# Patient Record
Sex: Female | Born: 1960 | Race: White | Hispanic: No | Marital: Married | Smoking: Never smoker
Health system: Southern US, Community
[De-identification: ages and names within clinical notes are randomized; demographics above are authoritative.]

## PROBLEM LIST (undated history)

## (undated) DIAGNOSIS — I872 Venous insufficiency (chronic) (peripheral): Secondary | ICD-10-CM

## (undated) DIAGNOSIS — I270 Primary pulmonary hypertension: Secondary | ICD-10-CM

## (undated) DIAGNOSIS — Z872 Personal history of diseases of the skin and subcutaneous tissue: Secondary | ICD-10-CM

## (undated) DIAGNOSIS — K219 Gastro-esophageal reflux disease without esophagitis: Secondary | ICD-10-CM

## (undated) DIAGNOSIS — Z800 Family history of malignant neoplasm of digestive organs: Secondary | ICD-10-CM

## (undated) DIAGNOSIS — Z8701 Personal history of pneumonia (recurrent): Secondary | ICD-10-CM

## (undated) DIAGNOSIS — N319 Neuromuscular dysfunction of bladder, unspecified: Secondary | ICD-10-CM

## (undated) DIAGNOSIS — I890 Lymphedema, not elsewhere classified: Secondary | ICD-10-CM

## (undated) DIAGNOSIS — Z9221 Personal history of antineoplastic chemotherapy: Secondary | ICD-10-CM

## (undated) DIAGNOSIS — C50912 Malignant neoplasm of unspecified site of left female breast: Secondary | ICD-10-CM

## (undated) DIAGNOSIS — R112 Nausea with vomiting, unspecified: Secondary | ICD-10-CM

## (undated) DIAGNOSIS — Z789 Other specified health status: Secondary | ICD-10-CM

## (undated) DIAGNOSIS — G4733 Obstructive sleep apnea (adult) (pediatric): Secondary | ICD-10-CM

## (undated) DIAGNOSIS — Z8619 Personal history of other infectious and parasitic diseases: Secondary | ICD-10-CM

## (undated) DIAGNOSIS — F329 Major depressive disorder, single episode, unspecified: Secondary | ICD-10-CM

## (undated) DIAGNOSIS — Z9889 Other specified postprocedural states: Secondary | ICD-10-CM

## (undated) DIAGNOSIS — Z9989 Dependence on other enabling machines and devices: Secondary | ICD-10-CM

## (undated) DIAGNOSIS — M797 Fibromyalgia: Secondary | ICD-10-CM

## (undated) DIAGNOSIS — F32A Depression, unspecified: Secondary | ICD-10-CM

## (undated) DIAGNOSIS — M1990 Unspecified osteoarthritis, unspecified site: Secondary | ICD-10-CM

## (undated) DIAGNOSIS — E049 Nontoxic goiter, unspecified: Secondary | ICD-10-CM

## (undated) DIAGNOSIS — F419 Anxiety disorder, unspecified: Secondary | ICD-10-CM

## (undated) DIAGNOSIS — Z803 Family history of malignant neoplasm of breast: Secondary | ICD-10-CM

## (undated) HISTORY — DX: Family history of malignant neoplasm of digestive organs: Z80.0

## (undated) HISTORY — PX: CARDIAC CATHETERIZATION: SHX172

## (undated) HISTORY — PX: TRANSTHORACIC ECHOCARDIOGRAM: SHX275

## (undated) HISTORY — PX: BILATERAL SALPINGOOPHORECTOMY: SHX1223

## (undated) HISTORY — PX: OTHER SURGICAL HISTORY: SHX169

## (undated) HISTORY — DX: Family history of malignant neoplasm of breast: Z80.3

---

## 1986-12-08 HISTORY — PX: APPENDECTOMY: SHX54

## 1989-12-08 HISTORY — PX: ABDOMINAL HYSTERECTOMY: SHX81

## 1991-12-09 HISTORY — PX: HERNIA REPAIR: SHX51

## 1998-08-10 ENCOUNTER — Encounter: Admission: RE | Admit: 1998-08-10 | Discharge: 1998-08-10 | Payer: Self-pay | Admitting: Sports Medicine

## 1999-12-09 HISTORY — PX: CARDIOVASCULAR STRESS TEST: SHX262

## 2002-12-08 HISTORY — PX: INTERSTIM IMPLANT PLACEMENT: SHX5130

## 2005-12-08 HISTORY — PX: CARPAL TUNNEL RELEASE: SHX101

## 2006-03-17 ENCOUNTER — Ambulatory Visit (HOSPITAL_COMMUNITY): Admission: RE | Admit: 2006-03-17 | Discharge: 2006-03-17 | Payer: Self-pay | Admitting: Urology

## 2006-07-01 ENCOUNTER — Ambulatory Visit (HOSPITAL_COMMUNITY): Admission: RE | Admit: 2006-07-01 | Discharge: 2006-07-02 | Payer: Self-pay | Admitting: Urology

## 2006-07-01 HISTORY — PX: BLADDER SUSPENSION: SHX72

## 2006-11-10 HISTORY — PX: REMOVAL OF URINARY SLING: SHX6218

## 2006-11-11 ENCOUNTER — Inpatient Hospital Stay (HOSPITAL_COMMUNITY): Admission: RE | Admit: 2006-11-11 | Discharge: 2006-11-12 | Payer: Self-pay | Admitting: Urology

## 2008-02-02 IMAGING — CR DG CHEST 2V
3 series · 3 of 3 positions shown · non-contrast
Comparison: None available.

CLINICAL DATA: Stress incontinence. 
 CHEST - 2 VIEW:

[view not recorded (1 of 3)]
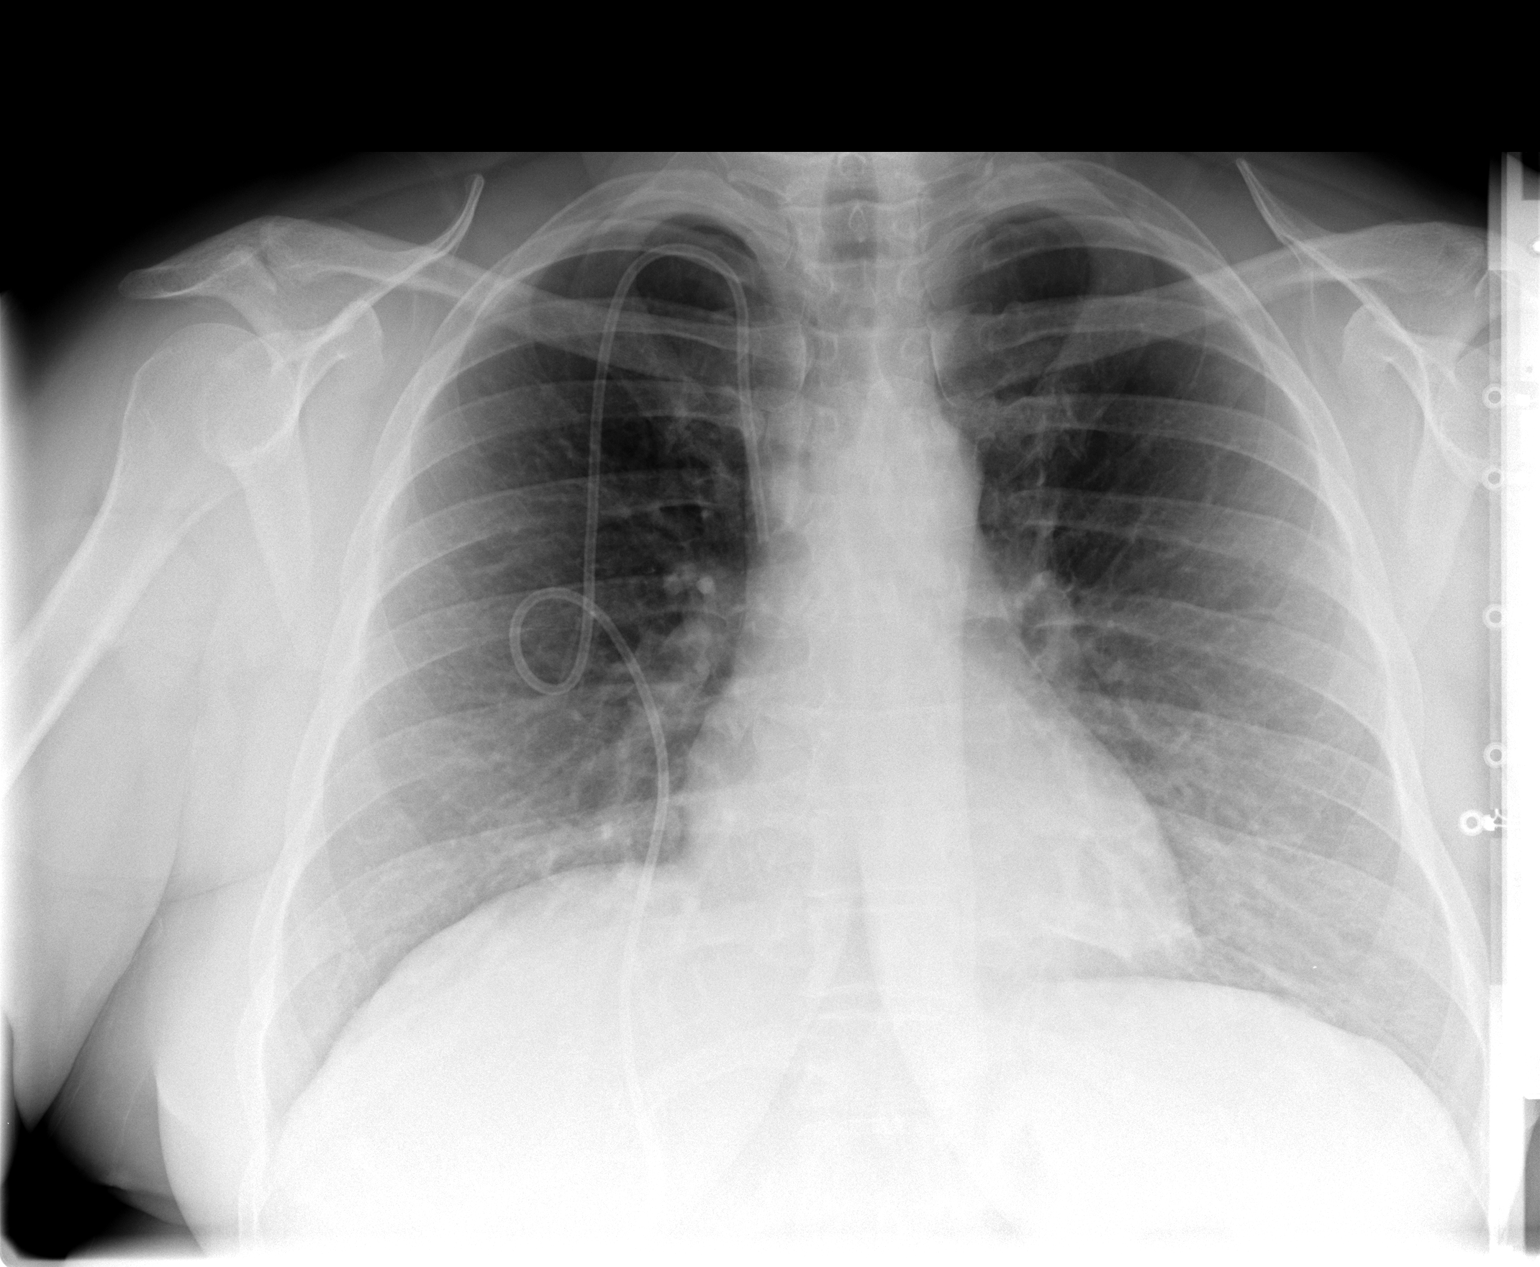

[view not recorded (2 of 3)]
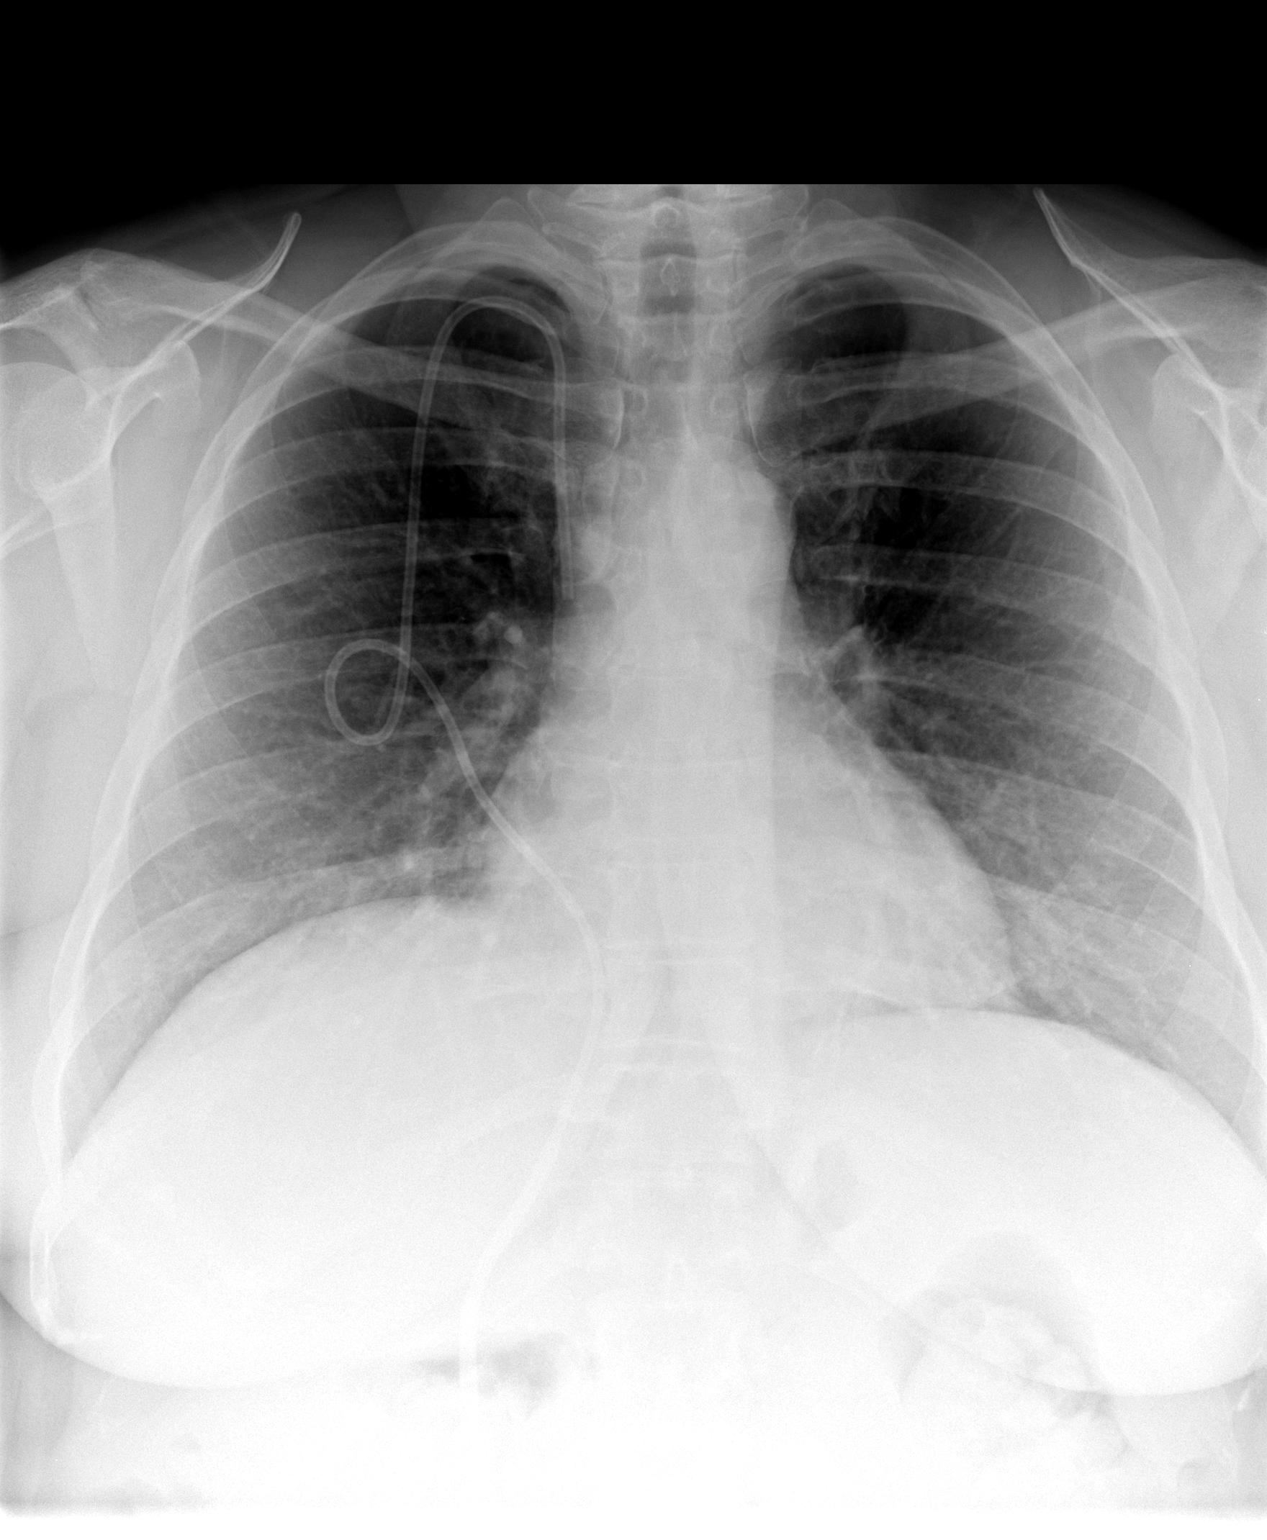

[view not recorded (3 of 3)]
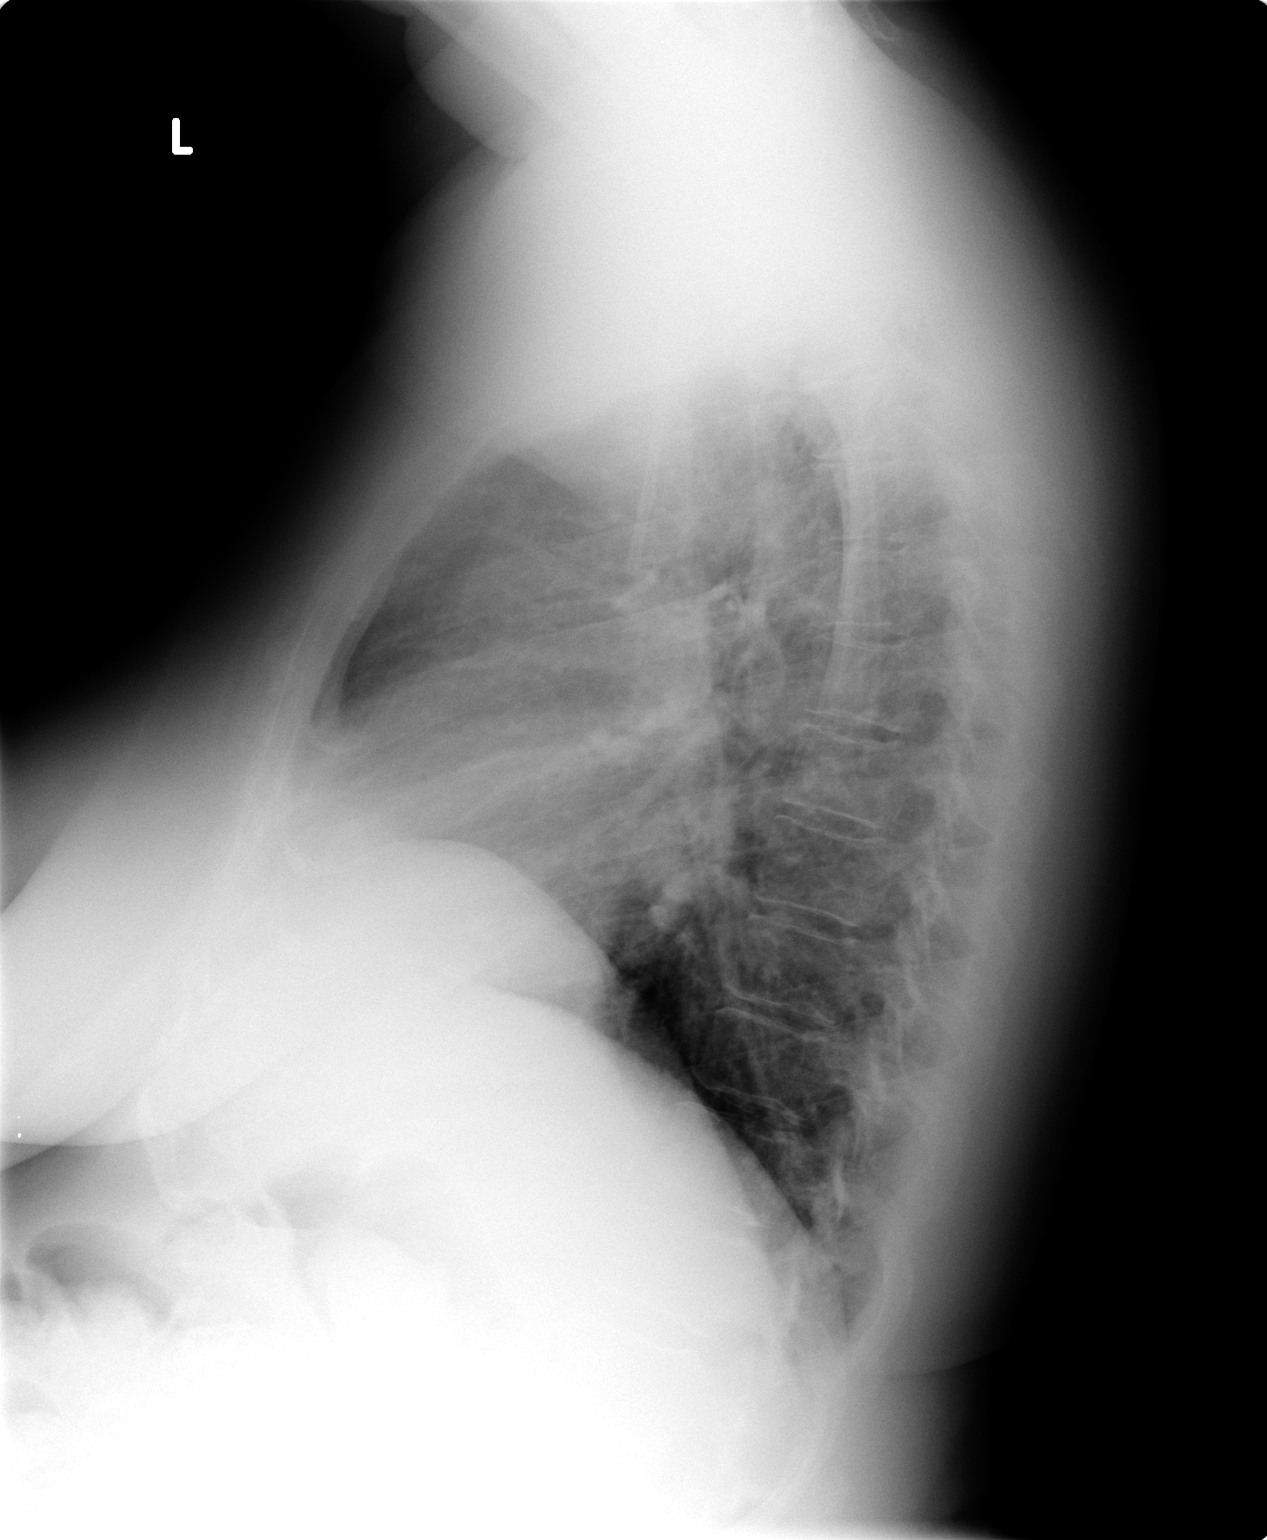

[3 of 3 positions shown; findings below may reference images not displayed]

FINDINGS: Right-sided central venous catheter is noted with tip in the projection of the SVC.  The heart size is normal.  There are no effusions.  No interstitial edema. 
 There are no focal lung opacities. 
 Review of the visualized osseous structures is unremarkable.
IMPRESSION: No active cardiopulmonary disease.

## 2009-12-08 HISTORY — PX: BELPHAROPTOSIS REPAIR: SHX369

## 2010-08-05 ENCOUNTER — Ambulatory Visit (HOSPITAL_BASED_OUTPATIENT_CLINIC_OR_DEPARTMENT_OTHER): Admission: RE | Admit: 2010-08-05 | Discharge: 2010-08-05 | Payer: Self-pay | Admitting: Urology

## 2010-08-05 HISTORY — PX: OTHER SURGICAL HISTORY: SHX169

## 2011-02-21 LAB — POCT I-STAT, CHEM 8
Calcium, Ion: 1.2 mmol/L (ref 1.12–1.32)
Chloride: 104 mEq/L (ref 96–112)
Creatinine, Ser: 0.8 mg/dL (ref 0.4–1.2)
Glucose, Bld: 84 mg/dL (ref 70–99)

## 2011-04-25 NOTE — Op Note (Signed)
NAME:  Diana Carroll, Diana Carroll             ACCOUNT NO.:  [id number]   MEDICAL RECORD NO.:  [id number]          PATIENT TYPE:  OIB   LOCATION:  19                         FACILITY:  [facility]   PHYSICIAN:  Reece Packer, MD DATE OF BIRTH:  02-02-1961   DATE OF PROCEDURE:  11/10/2006  DATE OF DISCHARGE:                               OPERATIVE REPORT   SURGEON:  Reece Packer, MD   ASSISTANT:  Peterson Lombard, MD   PREOPERATIVE NOTE:  Stress incontinence surgery, ureterolysis, plus  pubovaginal sling, plus cystoscopy.   INDICATIONS:  Diana Carroll has a neurogenic bladder.  She is on self-  catheterization.  She failed the [facility] sling.  My plan was to remove  this suburethral component of the synthetic sling and do a compressive  pubovaginal sling.   The patient was prepped and draped in the usual fashion.  Extra care was  taken to minimize the risk of compartment syndrome neuropathy and DVT.  Preoperative blood work was normal.  IV Remodulin was given before,  during, and after surgery.   The patient had a narrow introitus.  She had a high riding bladder neck  which made exposure little bit more difficult.  I used a weighted  vaginal speculum and a Kerr __________ retractor.  I made a 2.5 cm  incision in the midline underneath the urethra.  I dissected sharply;  and then identified the urethrovesical angle bilaterally.  I used the  double ring retractor, which worked very nicely.  Fortunately it was  very easy to see the synthetic sling in the mid urethra.  I dissected  down sharply with a scalpel; and then passed a small curved right angle  instrument around the sling.  I then cut the sling in the midline.  I  placed a tonsil on each free end and traced it up to the endopelvic  fascia bilaterally.  There was no injury to the urethra.   I then made a 4-cm incision one fingerbreadth above the symphysis pubis  in the area of a previous Pfannenstiel incision.  I dissected down  through a lot of subcutaneous fat to the scarred rectus fascia, one  fingerbreadth above the symphysis pubis.  On the patient's left I used a  double Pereira needle passer; and brought it down through the fascia  onto the pulp of my left index finger, up thorough the urethrovesical  angle.  I did the same thing on the right side.   I then cystoscoped the patient.  There was efflux of indigo carmine from  both ureteral orifices.  I thought the left Pereira needle was little  bit close to the bladder.  For this reason I removed it; and then passed  a single-pronged Stamey needle onto the pulp of my finger, down through  retropubic space, and out through the vaginal incision.  I recystoscoped  the patient and I was happy with its position.   I had previously cut a 10 x 2 cm dermal sling.  I over sewed each end  with #0 Prolene suture.  The sling and Prolene suture was brought  through the eye opening of the needle passers and brought up through the  retropubic space, up through the fascia.  There was a tissue bridge of  approximately 12 or 13 mm on each side.  I then used a benign bolster  system so the suture would not tear through the fascia.  A suture bridge  was made on the left side with a Mayo needle; and then both free ends  were brought out through the bolster.  The same was done on the right  side.   I then cystoscoped the patient; and once again the bladder was normal.  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  There was efflux of indigo carmine from both ureteral orifices.  I could  scope with a 22-degree lens parallel to the floor; and I could see a  little bit of collagen bleb from previous surgery.  Then I could pull up  on the dermal sling and easily compress the mid urethra in a winking  fashion.  Using direct cystoscopic vision, I had my assistant, and then  myself, tie down the Prolene sutures over the bolster.  I was very happy  with its  tension.  I cystoscoped the patient several times; and there is  no question that her urethra was coapted in the midline.  I could scope  through it; and I also on several occasions, passed a 16-French  catheter, through the now coapted urethra.   Copious irrigation was used in the vagina; and an abdominal incision.  I  did sew all the free ends of the Prolene together in the midline.  I  then used a 3-0 Vicryl for the subcutaneous tissue and then 4-0  subcuticular.  A dry dressing with Telfa was applied.  Then 2-0 Vicryl  was used to close the vaginal wall incision.   Total blood loss was approximately 400 mL.  When I first broke through  the endopelvic fascia on the left side she had bleeding; but this had  settled down by midway through the case.  A firm vaginal pack was  inserted.  Foley catheter draining well at the end of the case.  Leg  position was good.   I was very pleased with removal of the suburethral component of the  synthetic sling; and the application of a compressive sling in the  proximal urethra and mid urethra.  Hopefully this will reach her  treatment goal.           ______________________________  Reece Packer, MD  Electronically Signed     SAM/MEDQ  D:  11/10/2006  T:  11/10/2006  Job:  [id number]

## 2011-04-25 NOTE — Op Note (Signed)
Diana Carroll, Diana Carroll             ACCOUNT NO.:  [id number]   MEDICAL RECORD NO.:  [id number]          PATIENT TYPE:  AMB   LOCATION:  DAY                          FACILITY:  [facility]   PHYSICIAN:  Reece Packer, MD DATE OF BIRTH:  Jul 09, 1961   DATE OF PROCEDURE:  03/17/2006  DATE OF DISCHARGE:                                 OPERATIVE REPORT   PREOPERATIVE DIAGNOSIS:  Stress incontinence.   POSTOPERATIVE DIAGNOSIS:  Stress incontinence.   SURGERY:  Cystoscopy, transurethral collagen injection therapy.   Kelda Azad has a complicated history.  She is on clean intermittent  catheterization.  She has stress incontinence.  She was given intravenous  ciprofloxacin prior to procedure.   The patient was prepped and draped in the usual fashion.  The injection  scope was used for the initial inspection of the bladder.  The bladder  mucosa and trigone were normal.  There is no stitch, foreign body, or  carcinoma.  On inspection of the urethra, she actually had a fairly long  urethra, approximately 3 cm in length.   I injected three syringes of collagen at 3, 6, and 9 o'clock.  Unfortunately, there was extravasation in the 3 and 6 o'clock sites, but the  injection at 9 o'clock went very well and two syringes were utilized.  I was  hoping to inject more, especially at 12 o'clock, but unfortunately, she  developed hypotension during the case.  She is on a prostaglandin inhibitor  medical continuous drip for pulmonary hypertension.  Anesthesia felt that  this medication, in combination with her anesthesia, caused hypertension.  Her systolic dropped as low as approximately 60.  Anesthesia did a wonderful  job in having her blood pressure come up quite quickly utilizing the  Trendelenburg position, fluid resuscitation, and Neo-Synephrine drip.  She  was finally extubated in the operating room.  She was brought to the  recovery room.  She will be observed closely postoperatively.  The  decision  to send her home will be made by myself in combination with anesthesia.   During the case, her hypotension was severe enough that anesthesia was more  comfortable if I ended the injections quite abruptly so I did so.  I did not  examine her for her rectocele.  Overall, her injections went reasonably  well, but technically she did not coapt her urethra as well as I would like,  and again, there was some perforation sites with extravasation.  The  decision to inject further or to do surgery will be influenced by the  hypotension episode that was experienced today under anesthesia.           ______________________________  Reece Packer, MD  Electronically Signed     SAM/MEDQ  D:  03/17/2006  T:  03/17/2006  Job:  [id number]

## 2011-04-25 NOTE — Op Note (Signed)
NAMEASHLYN, [name]             ACCOUNT NO.:  [id number]   MEDICAL RECORD NO.:  [id number]          PATIENT TYPE:  AMB   LOCATION:  DAY                          FACILITY:  [facility]   PHYSICIAN:  Reece Packer, MD DATE OF BIRTH:  [birth year]   DATE OF PROCEDURE:  07/01/2006  DATE OF DISCHARGE:                                 OPERATIVE REPORT   PREOP DIAGNOSIS:  Stress incontinence with neurogenic bladder.   POSTOPERATIVE DIAGNOSIS:  Stress incontinence with neurogenic bladder.   SURGERY:  Sling cystourethropexy [facility]) plus cystoscopy.   Diana Carroll is a patient with neurogenic bladder.  She is on self-  catheterization.  She has stress incontinence.   She was prepped and draped in the usual fashion.  She was given preoperative  antibiotics.  Extra care was taken to position her legs to minimize the risk  of neuropathy, DVT and compartment syndrome.   A weighted vaginal speculum, curved __________ and Foley catheter was used  for exposure.  She did have a narrow introitus making the dissection a  little bit more difficult.  Two 1-cm incisions were made just lateral to the  midline one fingerbreadth above the symphysis pubis.  A 2.5-cm incision was  made overlying the urethra anteriorly.  I made a deep incision, making  certain that the pubocervical fascia and vaginal wall was thick to make a  nice flap to cover the sling.  I dissected bluntly with Metzenbaum scissors  to the symphysis pubis bilaterally.  Bleeding was minimal.  I could palpate  the bone bilaterally.   With the bladder empty I passed the [facility] needle on top of and then along  the back of the pubic bones parallel to the midline under the pulp of my  index finger.   I then cystoscoped the patient.  There was no injury to the bladder.  There  was no needle in the bladder even with wiggling it.  There was no  indentation.  There was reflux of indigo carmine from both ureteral  orifices.  Urethra was  normal.   I attached a sling and brought it up through the retropubic space as per  protocol with the bladder empty.  I tensioned the sling over a Kelly using  my usual technique.  I used the tip of the [place] as opposed to the more  proximal aspect to make it a little bit tighter.  There is no question the  sling was in the mid urethra.  The sling was not occluding or tenting the  urethra upward based upon direct visibility and cystoscopy.  The sling was a  little bit tighter than a normal loose sling tied in someone that is not  self-catheterizing.   The sling was cut in the suprapubic area just under the skin.  Copious  irrigation was utilized.  I did a running 2-0 Vicryl closure of the vaginal  incision followed by two interrupted sutures for further strength.  I closed  the suprapubic incisions with 4-0 Vicryl and then Dermabond.   Vaginal pack was inserted.  Foley catheter was draining well at the  end of  the case.  Leg position was good at the end of the case.   Hopefully the sling will reach the patient's treatment goal.           ______________________________  Reece Packer, MD  Electronically Signed     SAM/MEDQ  D:  07/01/2006  T:  07/01/2006  Job:  [id number]

## 2011-04-25 NOTE — H&P (Signed)
NAMETONNA, PALAZZI             ACCOUNT NO.:  [id number]   MEDICAL RECORD NO.:  [id number]          PATIENT TYPE:  INP   LOCATION:  58                         FACILITY:  [facility]   PHYSICIAN:  Reece Packer, MD DATE OF BIRTH:  03-10-1961   DATE OF ADMISSION:  11/10/2006  DATE OF DISCHARGE:  11/12/2006                              HISTORY & PHYSICAL   ADDENDUM:  Stress incontinence; admit for postoperative pain and  observation.   SURGERY:  Ureterolysis plus sling, history of urethropexy plus  cystoscopy.   Diana Carroll has complicated incontinence.  She has a neurogenic  bladder and performs self-catheterizations.  She has stress  incontinence.  She had a synthetic sling that was not effective.  The  sling was removed intraoperatively on December 4th.  Part of the  suburethral segment of the sling was removed on December 4th and a  nonsegment pubovaginal compressive sling was performed in addition to  cystoscopy.   One day following surgery, Diana Carroll's heart rate was mildly increased, and  her hemoglobin was 9.  She was having some discomfort, and I thought it  was best to bring her in overnight and admit her for observation.   She does have a lot of comorbidities and is on Remodulin for pulmonary  hypertension.  She also has lymphedema.  She continued to do well with  stable electrolytes, stable hemoglobin, and excellent urine output.  Her  abdominal incision looked good.   PAST HEALTH:  Pulmonary hypertension, lymphedema.  Other comorbidities  listed on her clinic notes.   MEDICATIONS:  Trimethoprim, Remodulin, Coumadin, Vicodin, Paxil,  Celebrex, Lyrica, Flexeril, Elavil, and other medications listed.   ALLERGIES:  MORPHINE, SULFA, and SUDAFED.   REVIEW OF SYSTEMS:  Otherwise noncontributory.   FAMILY HISTORY:  Is a nurse living in [place].   SOCIAL HISTORY:  Is active as a Marine scientist.   PHYSICAL EXAMINATION:  GENERAL:  Alert and oriented.  CARDIOVASCULAR:   Warm extremities.  Heart rate 75.  RESPIRATORY:  No shortness of breath at rest.  ABDOMEN:  No abdominal tenderness, masses, or hernia.  GENITOURINARY:  Well-supported bladder neck.  LYMPHATIC:  No pelvic lymph nodes.  MUSCULOSKELETAL:  Mobile lymphedema of the lower extremities.  NEUROLOGIC:  Normal perivaginal sensation.  SKIN:  No obvious rashes.   Diana Carroll was admitted to the hospital for observation with IV  fluids, pain medication, and antibiotics.           ______________________________  Reece Packer, MD  Electronically Signed     SAM/MEDQ  D:  11/12/2006  T:  11/12/2006  Job:  [id number]

## 2011-04-25 NOTE — Discharge Summary (Signed)
[name], [name]             ACCOUNT NO.:  [id number]   MEDICAL RECORD NO.:  [id number]          PATIENT TYPE:  INP   LOCATION:  76                         FACILITY:  [facility]   PHYSICIAN:  Reece Packer, MD DATE OF BIRTH:  30-Jun-1961   DATE OF ADMISSION:  11/10/2006  DATE OF DISCHARGE:  11/12/2006                               DISCHARGE SUMMARY   ADMISSION DIAGNOSIS:  Stress urinary incontinence; admitted for  postoperative pain and medical observation.   SURGERY:  December 4--urethrolysis plus sling; cystourethropexy.   SURGEON:  Reece Packer, MD.   ASSISTANT:  Peterson Lombard, MD   Diana Carroll has multiple comorbidities including pulmonary  hypertension, lymphedema, neurogenic bladder, and stress incontinence.  She underwent removal of the suburethral synthetic sling; and she  underwent a compressive pubovaginal sling.  One day following surgery  she was having some pain; and her hemoglobin had decreased to 9.0.  She  was a little bit tachycardic.  Because of her medical comorbidities, I  wanted to keep her in overnight; and admitted her to the hospital to  observe her for an extra day.  During the hospitalization her hemoglobin  was stable, her vitals were good, her urine output was excellent; and  she felt much better.  She was discharged home on December 6.   She was on Remodulin intravenously while in hospital.  She had no  medical problems with anesthesia or postoperatively with her breathing.  She was ambulating well.   On the day of discharge she could self-catheterize herself.  We went  over do's and don't and postoperative instructions.   I renewed her trimethoprim 100 mg daily, gave her Percocet #40,  ciprofloxacin 250 mg b.i.d. for 5 days, as well as Lovenox 150 mg #12.  She is going to restart her Coumadin and Lovenox 6 days following  surgery.   She is going to followup with me in two weeks' time.  My nurse will call  her in the next few days  to make sure she is doing well.           ______________________________  Reece Packer, MD  Electronically Signed     SAM/MEDQ  D:  11/12/2006  T:  11/12/2006  Job:  [id number]

## 2012-03-11 ENCOUNTER — Other Ambulatory Visit: Payer: Self-pay | Admitting: Urology

## 2012-04-05 ENCOUNTER — Encounter (HOSPITAL_BASED_OUTPATIENT_CLINIC_OR_DEPARTMENT_OTHER): Payer: Self-pay | Admitting: *Deleted

## 2012-04-05 NOTE — Progress Notes (Addendum)
To [facility] at 1000,Istat,Ekg on arrival -possible CXR-spoke w/Dr Denenny,will follow up with Dr Winfred Leeds also regarding pre-op needs.Npo after mn-to take tyvaso,tracleer,protonix with water only.Requested recent office visit note with pulmonologist Dr Kathi Ludwig. [phone])

## 2012-04-06 NOTE — Progress Notes (Signed)
Discussed with Dr Winfred Leeds pre-op needs-OK to use current CXR.

## 2012-04-07 NOTE — H&P (Signed)
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 She self catheterizes. She has failed Interstim. The symptoms are moderate to severe and affecting her quality of life. She has failed ___ and multiple antimuscarinics.   Today she underwent a number of tests which I personally reviewed. Urinalysis: Negative.  Her incontinence is stable. Her incomplete bladder emptying is stable.   Urinalysis: I reviewed, negative.    Past Medical History Problems  1. History of  Adult Sleep Apnea 780.57 2. History of  Anxiety (Symptom) 799.2 3. History of  Cervical Cancer V10.41 4. History of  Depression 311 5. History of  Electronic Bladder Stimulator 6. History of  Endoscopic Injection Of Implant Material Into Bladder Neck 7. History of  Esophageal Reflux 530.81 8. History of  Fibromyalgia 729.1 9. History of  Hypercholesterolemia 272.0 10. History of  Lymphedema 457.1 11. History of  Pulmonary Hypertension 416.0  Surgical History Problems  1. History of  Appendectomy 2. History of  Bladder Surgery 3. History of  Foot Surgery 4. History of  Hysterectomy 5. History of  Inguinal Hernia Repair 6. History of  Oophorectomy - Bilat (Removal Of Both Ovaries) Laparoscopic 7. History of  Peripheral Nerve Neurostimulator Removal Of Electrodes 8. History of  Peripheral Nerve Neurostimulator Removal Of Pulse Generator 9. History of  Tubal Ligation V25.2 10. History of  Vaginal Surgery Colpotomy  Current Meds 1. Amitriptyline HCl 25 MG Oral Tablet; Therapy: 03Feb2012 to 2. CeleBREX 200 MG Oral Capsule; Therapy: (Recorded:20Dec2007) to 3. Cymbalta CPEP; Therapy: (Recorded:18May2011) to 4. Elavil 50 MG TABS; Therapy: (Recorded:20Dec2007) to 5. Furosemide 20 MG Oral Tablet; Therapy: [id number] to 6. Hydrocodone-Acetaminophen 7.5-325 MG Oral Tablet; Therapy: 30Dec2011 to 7. Lactulose 10 GM/15ML Oral Solution; Therapy:  48GQB1694 to 8. Lyrica 150 MG Oral Capsule; Therapy: (Recorded:18May2011) to 9. Pantoprazole Sodium 40 MG Oral Tablet Delayed Release; Therapy: 50TUU8280 to 10. Protonix 40 MG Intravenous Solution Reconstituted; Therapy: (Recorded:20Dec2007) to 11. Soma 350 MG Oral Tablet; Therapy: (Recorded:18May2011) to 12. Sterile Diluent for Flolan Intravenous Solution; Therapy: 03KJZ7915 to 13. Tracleer 62.5 MG Oral Tablet; Therapy: 05WPV9480 to 14. Tyvaso 0.6 MG/ML Inhalation Solution; Therapy: (Recorded:22May2012) to 27. Vicodin ES 7.5-750 MG TABS; Therapy: (Recorded:20Dec2007) to 16. Zolpidem Tartrate 10 MG Oral Tablet; Therapy: 16PVV7482 to  Allergies Medication  1. Morphine Derivatives 2. Sudafed Plus TABS 3. Sulfa Drugs  Family History Problems  1. Maternal history of  Acute Myocardial Infarction 2. Family history of  Family Health Status Number Of Children 2 daughters 3. Family history of  Hypertension 401.9 Brothers 4. Paternal history of  Lung Cancer V16.1  Social History Problems  1. Caffeine Use Less than 1 per day 2. Family history of  Death In The Family Father Age 75 3. History of  Housing DME CPAP 4. Marital History - Currently Married 5. 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 Alcohol Use 8. Tobacco Use V15.82  Vitals Vital Signs [Data Includes: Last 1 Day]  [id number] 01:56PM  Blood Pressure: 122 / 74 Temperature: 98.6 F Heart Rate: 79  Results/Data Urine [Data Includes: Last 1 Day]   [id number]  COLOR YELLOW   APPEARANCE CLEAR   SPECIFIC GRAVITY 1.010   pH 6.5   GLUCOSE NEG mg/dL  BILIRUBIN NEG   KETONE NEG mg/dL  BLOOD NEG   PROTEIN NEG mg/dL  UROBILINOGEN 0.2 mg/dL  NITRITE NEG   LEUKOCYTE ESTERASE NEG    Assessment Assessed  1. Incomplete Emptying  Of Bladder 788.21 2. Urge And Stress Incontinence 788.33  Plan Health Maintenance (V70.0)  1. UA With REFLEX  Done: [id number] 01:44PM  Discussion/Summary   Diana Carroll is  going to have Botox 200 units under IV sedation because of her medical issues. I think she is an excellent candidate. I sent her urine for culture. As long as it is normal, I gave her 3 days of ciprofloxacin that she will start prior to the treatment.   I gave her 7 days of Cipro with 3 days prior and 4 days afterwards.   After a thorough review of the management options for the patient's condition the patient  elected to proceed with surgical therapy as noted above. We have discussed the potential benefits and risks of the procedure, side effects of the proposed treatment, the likelihood of the patient achieving the goals of the procedure, and any potential problems that might occur during the procedure or recuperation. Informed consent has been obtained.

## 2012-04-08 ENCOUNTER — Encounter (HOSPITAL_BASED_OUTPATIENT_CLINIC_OR_DEPARTMENT_OTHER): Payer: Self-pay | Admitting: *Deleted

## 2012-04-08 ENCOUNTER — Ambulatory Visit (HOSPITAL_BASED_OUTPATIENT_CLINIC_OR_DEPARTMENT_OTHER)
Admission: RE | Admit: 2012-04-08 | Discharge: 2012-04-08 | Disposition: A | Discharge status: Home / Self Care | Payer: Medicare Other | Source: Ambulatory Visit | Attending: Urology | Admitting: Urology

## 2012-04-08 ENCOUNTER — Encounter (HOSPITAL_BASED_OUTPATIENT_CLINIC_OR_DEPARTMENT_OTHER): Payer: Self-pay | Admitting: Anesthesiology

## 2012-04-08 ENCOUNTER — Ambulatory Visit (HOSPITAL_BASED_OUTPATIENT_CLINIC_OR_DEPARTMENT_OTHER): Payer: Medicare Other | Admitting: Anesthesiology

## 2012-04-08 ENCOUNTER — Encounter (HOSPITAL_BASED_OUTPATIENT_CLINIC_OR_DEPARTMENT_OTHER)
Admission: RE | Disposition: A | Discharge status: Home / Self Care | Payer: Self-pay | Source: Ambulatory Visit | Attending: Urology

## 2012-04-08 DIAGNOSIS — E7800 Pure hypercholesterolemia, unspecified: Secondary | ICD-10-CM | POA: Insufficient documentation

## 2012-04-08 DIAGNOSIS — R339 Retention of urine, unspecified: Secondary | ICD-10-CM | POA: Insufficient documentation

## 2012-04-08 DIAGNOSIS — Z79899 Other long term (current) drug therapy: Secondary | ICD-10-CM | POA: Insufficient documentation

## 2012-04-08 DIAGNOSIS — N3946 Mixed incontinence: Secondary | ICD-10-CM | POA: Insufficient documentation

## 2012-04-08 DIAGNOSIS — G4730 Sleep apnea, unspecified: Secondary | ICD-10-CM | POA: Insufficient documentation

## 2012-04-08 DIAGNOSIS — Z8541 Personal history of malignant neoplasm of cervix uteri: Secondary | ICD-10-CM | POA: Insufficient documentation

## 2012-04-08 DIAGNOSIS — K219 Gastro-esophageal reflux disease without esophagitis: Secondary | ICD-10-CM | POA: Insufficient documentation

## 2012-04-08 DIAGNOSIS — N318 Other neuromuscular dysfunction of bladder: Secondary | ICD-10-CM | POA: Insufficient documentation

## 2012-04-08 HISTORY — DX: Fibromyalgia: M79.7

## 2012-04-08 HISTORY — DX: Nausea with vomiting, unspecified: R11.2

## 2012-04-08 HISTORY — DX: Other specified postprocedural states: Z98.890

## 2012-04-08 HISTORY — DX: Major depressive disorder, single episode, unspecified: F32.9

## 2012-04-08 HISTORY — DX: Unspecified osteoarthritis, unspecified site: M19.90

## 2012-04-08 HISTORY — DX: Gastro-esophageal reflux disease without esophagitis: K21.9

## 2012-04-08 HISTORY — PX: CYSTOSCOPY WITH INJECTION: SHX1424

## 2012-04-08 HISTORY — DX: Depression, unspecified: F32.A

## 2012-04-08 HISTORY — DX: Anxiety disorder, unspecified: F41.9

## 2012-04-08 LAB — POCT I-STAT 4, (NA,K, GLUC, HGB,HCT)
Glucose, Bld: 91 mg/dL (ref 70–99)
HCT: 40 % (ref 36.0–46.0)
Hemoglobin: 13.6 g/dL (ref 12.0–15.0)
Sodium: 142 mEq/L (ref 135–145)

## 2012-04-08 SURGERY — CYSTOSCOPY, WITH INJECTION OF BLADDER NECK OR BLADDER WALL
Anesthesia: General | Site: Bladder | Wound class: Clean Contaminated

## 2012-04-08 MED ORDER — ONDANSETRON HCL 4 MG/2ML IJ SOLN
INTRAMUSCULAR | Status: DC | PRN
  Administered 2012-04-08: 4 mg via INTRAVENOUS

## 2012-04-08 MED ORDER — DEXAMETHASONE SODIUM PHOSPHATE 4 MG/ML IJ SOLN
INTRAMUSCULAR | Status: DC | PRN
  Administered 2012-04-08: 10 mg via INTRAVENOUS

## 2012-04-08 MED ORDER — FENTANYL CITRATE 0.05 MG/ML IJ SOLN
INTRAMUSCULAR | Status: DC | PRN
  Administered 2012-04-08 (×2): 50 ug via INTRAVENOUS

## 2012-04-08 MED ORDER — LACTATED RINGERS IV SOLN
INTRAVENOUS | Status: DC
  Administered 2012-04-08 (×2): via INTRAVENOUS

## 2012-04-08 MED ORDER — GENTAMICIN IN SALINE 1.6-0.9 MG/ML-% IV SOLN
80.0000 mg | INTRAVENOUS | Status: AC
  Administered 2012-04-08: 80 mg via INTRAVENOUS

## 2012-04-08 MED ORDER — PROPOFOL 10 MG/ML IV EMUL
INTRAVENOUS | Status: DC | PRN
  Administered 2012-04-08: 200 mg via INTRAVENOUS

## 2012-04-08 MED ORDER — CIPROFLOXACIN IN D5W 400 MG/200ML IV SOLN
INTRAVENOUS | Status: DC | PRN
  Administered 2012-04-08: 400 mg via INTRAVENOUS

## 2012-04-08 MED ORDER — STERILE WATER FOR IRRIGATION IR SOLN
Status: DC | PRN
  Administered 2012-04-08: 3000 mL via INTRAVESICAL

## 2012-04-08 MED ORDER — SODIUM CHLORIDE 0.9 % IJ SOLN
INTRAMUSCULAR | Status: DC | PRN
  Administered 2012-04-08: 20 mL via INTRAVENOUS

## 2012-04-08 MED ORDER — LIDOCAINE HCL (CARDIAC) 20 MG/ML IV SOLN
INTRAVENOUS | Status: DC | PRN
  Administered 2012-04-08: 50 mg via INTRAVENOUS

## 2012-04-08 MED ORDER — ONABOTULINUMTOXINA 100 UNITS IJ SOLR
INTRAMUSCULAR | Status: DC | PRN
  Administered 2012-04-08: 200 [IU] via INTRAMUSCULAR

## 2012-04-08 MED ORDER — KETOROLAC TROMETHAMINE 30 MG/ML IJ SOLN: 15.0000 mg | Freq: Once | INTRAMUSCULAR | Status: DC | PRN

## 2012-04-08 MED ORDER — EPHEDRINE SULFATE 50 MG/ML IJ SOLN
INTRAMUSCULAR | Status: DC | PRN
  Administered 2012-04-08: 15 mg via INTRAVENOUS

## 2012-04-08 MED ORDER — DROPERIDOL 2.5 MG/ML IJ SOLN
INTRAMUSCULAR | Status: DC | PRN
  Administered 2012-04-08: 0.625 mg via INTRAVENOUS

## 2012-04-08 MED ORDER — FENTANYL CITRATE 0.05 MG/ML IJ SOLN: 25.0000 ug | INTRAMUSCULAR | Status: DC | PRN

## 2012-04-08 MED ORDER — PROMETHAZINE HCL 25 MG/ML IJ SOLN: 6.2500 mg | INTRAMUSCULAR | Status: DC | PRN

## 2012-04-08 SURGICAL SUPPLY — 17 items
BAG DRAIN URO-CYSTO SKYTR STRL (DRAIN) ×2 IMPLANT
CANISTER SUCT LVC 12 LTR MEDI- (MISCELLANEOUS) IMPLANT
CATH ROBINSON RED A/P 12FR (CATHETERS) IMPLANT
CLOTH BEACON ORANGE TIMEOUT ST (SAFETY) ×2 IMPLANT
DRAPE CAMERA CLOSED 9X96 (DRAPES) ×2 IMPLANT
ELECT REM PT RETURN 9FT ADLT (ELECTROSURGICAL)
ELECTRODE REM PT RTRN 9FT ADLT (ELECTROSURGICAL) IMPLANT
GLOVE BIO SURGEON STRL SZ7.5 (GLOVE) ×2 IMPLANT
GOWN PREVENTION PLUS LG XLONG (DISPOSABLE) ×2 IMPLANT
GOWN STRL REIN XL XLG (GOWN DISPOSABLE) ×2 IMPLANT
NDL SAFETY ECLIPSE 18X1.5 (NEEDLE) ×1 IMPLANT
NEEDLE HYPO 18GX1.5 SHARP (NEEDLE) ×1
PACK CYSTOSCOPY (CUSTOM PROCEDURE TRAY) ×2 IMPLANT
SYR 20CC LL (SYRINGE) IMPLANT
SYR 30ML LL (SYRINGE) ×4 IMPLANT
SYR BULB IRRIGATION 50ML (SYRINGE) IMPLANT
WATER STERILE IRR 3000ML UROMA (IV SOLUTION) ×2 IMPLANT

## 2012-04-08 NOTE — Interval H&P Note (Signed)
History and Physical Interval Note:  04/08/2012 10:07 AM  Diana Carroll  has presented today for surgery, with the diagnosis of urgency incontinence  The various methods of treatment have been discussed with the patient and family. After consideration of risks, benefits and other options for treatment, the patient has consented to  Procedure(s) (LRB): CYSTOSCOPY WITH INJECTION (N/A) as a surgical intervention .  The patients' history has been reviewed, patient examined, no change in status, stable for surgery.  I have reviewed the patients' chart and labs.  Questions were answered to the patient's satisfaction.     Arvilla Salada A

## 2012-04-08 NOTE — Op Note (Signed)
Preoperative diagnosis: Neurogenic detrusor overactivity and urgency incontinence Postoperative diagnosis: Neurogenic detrusor overactivity and urgency incontinence Surgery: Cystoscopy and injection of botulinum toxin Surgeon: Dr. Nicki Reaper [name]  Diana Carroll has the above diagnoses and consented to the above procedure preoperative laboratory tests were normal. She's given preoperative ciprofloxacin. Clinically she was not infected. Initially she was ordered gentamicin and this was discontinued  The ACMI scope was utilized. She grade 2/4 bladder trabeculation. There is no erythema or cystitis. I injected 200 units of Botox instilled in 20 cc of saline throughout the bladder with my usual template. The injections were primarily at 5 and 7:00 and cephalad to the inter-ureteric ridge in the lower half of the bladder.  On further inspection there is no foreign body or cancer. There was no bleeding.

## 2012-04-08 NOTE — Anesthesia Postprocedure Evaluation (Signed)
  Anesthesia Post-op Note  Patient: Diana Carroll  Procedure(s) Performed: Procedure(s) (LRB): CYSTOSCOPY WITH INJECTION (N/A)  Patient Location: PACU  Anesthesia Type: General  Level of Consciousness: awake and alert   Airway and Oxygen Therapy: Patient Spontanous Breathing  Post-op Pain: mild  Post-op Assessment: Post-op Vital signs reviewed, Patient's Cardiovascular Status Stable, Respiratory Function Stable, Patent Airway and No signs of Nausea or vomiting  Post-op Vital Signs: stable  Complications: No apparent anesthesia complications

## 2012-04-08 NOTE — Anesthesia Procedure Notes (Signed)
Procedure Name: LMA Insertion Date/Time: 04/08/2012 11:11 AM Performed by: Christiana Fuchs Pre-anesthesia Checklist: Patient identified, Emergency Drugs available, Suction available and Patient being monitored Patient Re-evaluated:Patient Re-evaluated prior to inductionOxygen Delivery Method: Circle System Utilized Preoxygenation: Pre-oxygenation with 100% oxygen Intubation Type: IV induction Ventilation: Mask ventilation without difficulty LMA: LMA inserted LMA Size: 4.0 Number of attempts: 1 Airway Equipment and Method: bite block Placement Confirmation: positive ETCO2 Tube secured with: Tape Dental Injury: Teeth and Oropharynx as per pre-operative assessment

## 2012-04-08 NOTE — Discharge Instructions (Signed)
I have reviewed discharge instructions in detail with the patient. They will follow-up with me or their physician as scheduled. My nurse will also be calling the patients as per protocol.  °Post Anesthesia Home Care Instructions ° °Activity: °Get plenty of rest for the remainder of the day. A responsible adult should stay with you for 24 hours following the procedure.  °For the next 24 hours, DO NOT: °-Drive a car °-Operate machinery °-Drink alcoholic beverages °-Take any medication unless instructed by your physician °-Make any legal decisions or sign important papers. ° °Meals: °Start with liquid foods such as gelatin or soup. Progress to regular foods as tolerated. Avoid greasy, spicy, heavy foods. If nausea and/or vomiting occur, drink only clear liquids until the nausea and/or vomiting subsides. Call your physician if vomiting continues. ° °Special Instructions/Symptoms: °Your throat may feel dry or sore from the anesthesia or the breathing tube placed in your throat during surgery. If this causes discomfort, gargle with warm salt water. The discomfort should disappear within 24 hours. °CYSTOSCOPY HOME CARE INSTRUCTIONS ° °Activity: °Rest for the remainder of the day.  Do not drive or operate equipment today.  You may resume normal activities in one to two days as instructed by your physician.  ° °Meals: °Drink plenty of liquids and eat light foods such as gelatin or soup this evening.  You may return to a normal meal plan tomorrow. ° °Return to Work: °You may return to work in one to two days or as instructed by your physician. ° °Special Instructions / Symptoms: °Call your physician if any of these symptoms occur: ° ° -persistent or heavy bleeding ° -bleeding which continues after first few urination ° -large blood clots that are difficult to pass ° -urine stream diminishes or stops completely ° -fever equal to or higher than 101 degrees Farenheit. ° -cloudy urine with a strong, foul odor ° -severe  pain ° °Females should always wipe from front to back after elimination.  You may feel some burning pain when you urinate.  This should disappear with time.  Applying moist heat to the lower abdomen or a hot tub bath may help relieve the pain. \ ° °Follow-Up / Date of Return Visit to Your Physician:  *** °Call for an appointment to arrange follow-up. ° °Patient Signature:  ________________________________________________________ ° °Nurse's Signature:  ________________________________________________________ ° °

## 2012-04-08 NOTE — Anesthesia Preprocedure Evaluation (Addendum)
Anesthesia Evaluation  Patient identified by MRN, date of birth, ID band Patient awake    Reviewed: Allergy & Precautions, H&P , NPO status , Patient's Chart, lab work & pertinent test results  History of Anesthesia Complications (+) PONV  Airway Mallampati: II TM Distance: <3 FB Neck ROM: Full    Dental No notable dental hx.    Pulmonary sleep apnea ,  breath sounds clear to auscultation  Pulmonary exam normal       Cardiovascular negative cardio ROS  Rhythm:Regular Rate:Normal     Neuro/Psych negative neurological ROS  negative psych ROS   GI/Hepatic Neg liver ROS, GERD-  Medicated,  Endo/Other  negative endocrine ROS  Renal/GU negative Renal ROS  negative genitourinary   Musculoskeletal  (+) Fibromyalgia -  Abdominal   Peds negative pediatric ROS (+)  Hematology negative hematology ROS (+)   Anesthesia Other Findings   Reproductive/Obstetrics negative OB ROS                          Anesthesia Physical Anesthesia Plan  ASA: III  Anesthesia Plan: General   Post-op Pain Management:    Induction: Intravenous  Airway Management Planned: LMA  Additional Equipment:   Intra-op Plan:   Post-operative Plan:   Informed Consent: I have reviewed the patients History and Physical, chart, labs and discussed the procedure including the risks, benefits and alternatives for the proposed anesthesia with the patient or authorized representative who has indicated his/her understanding and acceptance.   Dental advisory given  Plan Discussed with: CRNA  Anesthesia Plan Comments:         Anesthesia Quick Evaluation

## 2012-04-08 NOTE — Transfer of Care (Signed)
Immediate Anesthesia Transfer of Care Note  Patient: Diana Carroll  Procedure(s) Performed: Procedure(s) (LRB): CYSTOSCOPY WITH INJECTION (N/A)  Patient Location: Patient transported to PACU with oxygen via face mask at 4 Liters / Min  Anesthesia Type: General  Level of Consciousness: awake and alert   Airway & Oxygen Therapy: Patient Spontanous Breathing and Patient connected to face mask oxygen  Post-op Assessment: Report given to PACU RN and Post -op Vital signs reviewed and stable  Post vital signs: Reviewed and stable  Dentition: Teeth and oropharynx remain in pre-op condition  Complications: No apparent anesthesia complications

## 2012-04-09 ENCOUNTER — Encounter (HOSPITAL_BASED_OUTPATIENT_CLINIC_OR_DEPARTMENT_OTHER): Payer: Self-pay | Admitting: Urology

## 2012-04-14 ENCOUNTER — Encounter (HOSPITAL_BASED_OUTPATIENT_CLINIC_OR_DEPARTMENT_OTHER): Payer: Self-pay

## 2012-07-13 NOTE — Addendum Note (Signed)
Addendum  created 07/13/12 0705 by Shakinah Navis, MD   Modules edited:Anesthesia Attestations, Anesthesia Responsible Staff    

## 2012-07-13 NOTE — Addendum Note (Signed)
Addendum  created 07/13/12 0705 by Myrtie Soman, MD   Modules edited:Anesthesia Attestations, Anesthesia Responsible Staff

## 2012-10-08 HISTORY — PX: OTHER SURGICAL HISTORY: SHX169

## 2012-12-09 HISTORY — PX: OTHER SURGICAL HISTORY: SHX169

## 2013-02-22 ENCOUNTER — Other Ambulatory Visit: Payer: Self-pay | Admitting: Urology

## 2013-03-03 ENCOUNTER — Encounter (HOSPITAL_BASED_OUTPATIENT_CLINIC_OR_DEPARTMENT_OTHER): Payer: Self-pay | Admitting: *Deleted

## 2013-03-03 NOTE — Progress Notes (Signed)
NPO AFTER MN. ARRIVES AT 0945. NEEDS ISTAT. CURRENT EKG, CXR, LOV W/ PULMOLOGIST AND ONCOLOGIST TO FAXED FROM [facility].  WILL TAKE CYMBALTA, TRAMADOL, LYRICA, BOSENTAN, AND TYVASO INHALER AM OF SURG W/ SIPS OF WATER.

## 2013-03-07 NOTE — H&P (Signed)
istory of Present Illness   Diana Carroll is much better post Botox. She is on self catheterization. She is almost completely dry during the day wearing one or less pads. She still leaks at night if she drinks Dr. Malachi Bonds and we talked about nocturnal diuresis, leg edema and bladder overactivity and Botox.  Review of systems: No change in bowel or neurologic status.   Her neurogenic bladder and incomplete bladder emptying is stable.   Urinalysis: I reviewed, negative.    Past Medical History Problems  1. History of  Adult Sleep Apnea 780.57 2. History of  Anxiety (Symptom) 799.2 3. History of  Cervical Cancer V10.41 4. History of  Depression 311 5. History of  Electronic Bladder Stimulator 6. History of  Endoscopic Injection Of Implant Material Into Bladder Neck 7. History of  Esophageal Reflux 530.81 8. History of  Fibromyalgia 729.1 9. History of  Hypercholesterolemia 272.0 10. History of  Lymphedema 457.1 11. History of  Pulmonary Hypertension 416.0  Surgical History Problems  1. History of  Appendectomy 2. History of  Bladder Surgery 3. History of  Foot Surgery 4. History of  Hysterectomy 5. History of  Inguinal Hernia Repair 6. History of  Oophorectomy - Bilat (Removal Of Both Ovaries) Laparoscopic 7. History of  Peripheral Nerve Neurostimulator Removal Of Electrodes 8. History of  Peripheral Nerve Neurostimulator Removal Of Pulse Generator 9. History of  Tubal Ligation V25.2 10. History of  Urologic Surgery 11. History of  Vaginal Surgery Colpotomy  Current Meds 1. Amitriptyline HCl 25 MG Oral Tablet; Therapy: 03Feb2012 to 2. CeleBREX 200 MG Oral Capsule; Therapy: (Recorded:20Dec2007) to 3. Ciprofloxacin HCl 250 MG Oral Tablet; TAKE 1 TABLET EVERY 12 HOURS DAILY; Therapy:  [id number] to (Evaluate:04Apr2013); Last Rx:28Mar2013 4. Cymbalta CPEP; Therapy: (Recorded:18May2011) to 5. Elavil 50 MG TABS; Therapy: (Recorded:20Dec2007) to 6. Furosemide 20 MG Oral Tablet;  Therapy: 23FTD3220 to 7. Hydrocodone-Acetaminophen 7.5-325 MG Oral Tablet; Therapy: 30Dec2011 to 8. Lactulose 10 GM/15ML Oral Solution; Therapy: 25KYH0623 to 9. Lyrica 150 MG Oral Capsule; Therapy: (Recorded:18May2011) to 10. Pantoprazole Sodium 40 MG Oral Tablet Delayed Release; Therapy: 76EGB1517 to 11. Protonix 40 MG Intravenous Solution Reconstituted; Therapy: (Recorded:20Dec2007) to 12. Soma 350 MG Oral Tablet; Therapy: (Recorded:18May2011) to 13. Sterile Diluent for Flolan Intravenous Solution; Therapy: 61YWV3710 to 14. Tracleer 62.5 MG Oral Tablet; Therapy: 62IRS8546 to 15. Tyvaso 0.6 MG/ML Inhalation Solution; Therapy: (Recorded:22May2012) to 52. Vicodin ES 7.5-750 MG TABS; Therapy: (Recorded:20Dec2007) to 17. Zolpidem Tartrate 10 MG Oral Tablet; Therapy: 27OJJ0093 to  Allergies Medication  1. Morphine Derivatives 2. Sudafed Plus TABS 3. Sulfa Drugs  Family History Problems  1. Maternal history of  Acute Myocardial Infarction 2. Family history of  Family Health Status Number Of Children 2 daughters 3. Family history of  Hypertension 401.9 Brothers 4. Paternal history of  Lung Cancer V16.1  Social History Problems  1. Caffeine Use Less than 1 per day 2. Family history of  Death In The Family Father Age 47 3. History of  Housing DME CPAP 4. Marital History - Currently Married 5. 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 Alcohol Use 8. Tobacco Use V15.82  Results/Data  Urine [Data Includes: Last 1 Day]   [id number]  COLOR YELLOW   APPEARANCE CLEAR   SPECIFIC GRAVITY 1.015   pH 5.5   GLUCOSE NEG mg/dL  BILIRUBIN NEG   KETONE NEG mg/dL  BLOOD NEG   PROTEIN NEG mg/dL  UROBILINOGEN 0.2 mg/dL  NITRITE NEG   LEUKOCYTE ESTERASE NEG  Assessment Assessed  1. Incomplete Emptying Of Bladder 788.21 2. 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 She was soaking at least 4 pads a day prior. I will see her on a p.r.n. basis. I will rule out urinary tract infection prior to retreatment.  After a thorough review of the management options for the patient's condition the patient  elected to proceed with surgical therapy as noted above. We have discussed the potential benefits and risks of the procedure, side effects of the proposed treatment, the likelihood of the patient achieving the goals of the procedure, and any potential problems that might occur during the procedure or recuperation. Informed consent has been obtained.

## 2013-03-08 ENCOUNTER — Ambulatory Visit (HOSPITAL_BASED_OUTPATIENT_CLINIC_OR_DEPARTMENT_OTHER): Admission: RE | Admit: 2013-03-08 | Payer: Medicare Other | Source: Ambulatory Visit | Admitting: Urology

## 2013-03-08 HISTORY — DX: Personal history of pneumonia (recurrent): Z87.01

## 2013-03-08 HISTORY — DX: Venous insufficiency (chronic) (peripheral): I87.2

## 2013-03-08 HISTORY — DX: Malignant neoplasm of unspecified site of left female breast: C50.912

## 2013-03-08 HISTORY — DX: Neuromuscular dysfunction of bladder, unspecified: N31.9

## 2013-03-08 HISTORY — DX: Dependence on other enabling machines and devices: Z99.89

## 2013-03-08 HISTORY — DX: Obstructive sleep apnea (adult) (pediatric): G47.33

## 2013-03-08 HISTORY — DX: Personal history of other infectious and parasitic diseases: Z86.19

## 2013-03-08 HISTORY — DX: Primary pulmonary hypertension: I27.0

## 2013-03-08 SURGERY — CYSTOSCOPY
Anesthesia: Regional

## 2013-03-16 ENCOUNTER — Other Ambulatory Visit: Payer: Self-pay | Admitting: Urology

## 2013-04-04 HISTORY — PX: OTHER SURGICAL HISTORY: SHX169

## 2013-04-13 ENCOUNTER — Encounter (HOSPITAL_BASED_OUTPATIENT_CLINIC_OR_DEPARTMENT_OTHER): Payer: Self-pay | Admitting: *Deleted

## 2013-04-13 NOTE — Progress Notes (Addendum)
NPO AFTER MN. ARRIVES AT 0615. NEEDS ISTAT. CURRENT EKG, CXR, AND LOV FROM PULMOLOGIST AND ONCOLOGIST TO  BE FAXED FROM [facility]. WILL TAKE CYMBALTA, LYRICA, BOSENTAN, TRAMADOL, AND DO TYVASO INHALER W/ SIPS OF WATER AM OF SURG.  REVIEWED ALL INFO REQUESTED FROM [facility], W/ CHART.

## 2013-04-14 ENCOUNTER — Encounter (HOSPITAL_BASED_OUTPATIENT_CLINIC_OR_DEPARTMENT_OTHER): Payer: Self-pay | Admitting: *Deleted

## 2013-04-15 NOTE — H&P (Signed)
History of Present Illness   Diana Carroll is much better post Botox. She is on self catheterization. She is almost completely dry during the day wearing one or less pads. She still leaks at night if she drinks Dr. Malachi Bonds and we talked about nocturnal diuresis, leg edema and bladder overactivity and Botox.  Review of systems: No change in bowel or neurologic status.   Her neurogenic bladder and incomplete bladder emptying is stable.   Urinalysis: I reviewed, negative.    Past Medical History Problems  1. History of  Adult Sleep Apnea 780.57 2. History of  Anxiety (Symptom) 799.2 3. History of  Cervical Cancer V10.41 4. History of  Depression 311 5. History of  Electronic Bladder Stimulator 6. History of  Endoscopic Injection Of Implant Material Into Bladder Neck 7. History of  Esophageal Reflux 530.81 8. History of  Fibromyalgia 729.1 9. History of  Hypercholesterolemia 272.0 10. History of  Lymphedema 457.1 11. History of  Pulmonary Hypertension 416.0  Surgical History Problems  1. History of  Appendectomy 2. History of  Bladder Surgery 3. History of  Foot Surgery 4. History of  Hysterectomy 5. History of  Inguinal Hernia Repair 6. History of  Oophorectomy - Bilat (Removal Of Both Ovaries) Laparoscopic 7. History of  Peripheral Nerve Neurostimulator Removal Of Electrodes 8. History of  Peripheral Nerve Neurostimulator Removal Of Pulse Generator 9. History of  Tubal Ligation V25.2 10. History of  Urologic Surgery 11. History of  Vaginal Surgery Colpotomy  Current Meds 1. Amitriptyline HCl 25 MG Oral Tablet; Therapy: 03Feb2012 to 2. CeleBREX 200 MG Oral Capsule; Therapy: (Recorded:20Dec2007) to 3. Ciprofloxacin HCl 250 MG Oral Tablet; TAKE 1 TABLET EVERY 12 HOURS DAILY; Therapy:  [id number] to (Evaluate:04Apr2013); Last Rx:28Mar2013 4. Cymbalta CPEP; Therapy: (Recorded:18May2011) to 5. Elavil 50 MG TABS; Therapy: (Recorded:20Dec2007) to 6. Furosemide 20 MG Oral Tablet;  Therapy: 27OZD6644 to 7. Hydrocodone-Acetaminophen 7.5-325 MG Oral Tablet; Therapy: 30Dec2011 to 8. Lactulose 10 GM/15ML Oral Solution; Therapy: 03KVQ2595 to 9. Lyrica 150 MG Oral Capsule; Therapy: (Recorded:18May2011) to 10. Pantoprazole Sodium 40 MG Oral Tablet Delayed Release; Therapy: 63OVF6433 to 11. Protonix 40 MG Intravenous Solution Reconstituted; Therapy: (Recorded:20Dec2007) to 12. Soma 350 MG Oral Tablet; Therapy: (Recorded:18May2011) to 13. Sterile Diluent for Flolan Intravenous Solution; Therapy: 29JJO8416 to 14. Tracleer 62.5 MG Oral Tablet; Therapy: 60YTK1601 to 15. Tyvaso 0.6 MG/ML Inhalation Solution; Therapy: (Recorded:22May2012) to 29. Vicodin ES 7.5-750 MG TABS; Therapy: (Recorded:20Dec2007) to 17. Zolpidem Tartrate 10 MG Oral Tablet; Therapy: 09NAT5573 to  Allergies Medication  1. Morphine Derivatives 2. Sudafed Plus TABS 3. Sulfa Drugs  Family History Problems  1. Maternal history of  Acute Myocardial Infarction 2. Family history of  Family Health Status Number Of Children 2 daughters 3. Family history of  Hypertension 401.9 Brothers 4. Paternal history of  Lung Cancer V16.1  Social History Problems  1. Caffeine Use Less than 1 per day 2. Family history of  Death In The Family Father Age 76 3. History of  Housing DME CPAP 4. Marital History - Currently Married 5. 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 Alcohol Use 8. Tobacco Use V15.82  Results/Data  Urine [Data Includes: Last 1 Day]   [id number]  COLOR YELLOW   APPEARANCE CLEAR   SPECIFIC GRAVITY 1.015   pH 5.5   GLUCOSE NEG mg/dL  BILIRUBIN NEG   KETONE NEG mg/dL  BLOOD NEG   PROTEIN NEG mg/dL  UROBILINOGEN 0.2 mg/dL  NITRITE NEG   LEUKOCYTE ESTERASE NEG  Assessment Assessed  1. Incomplete Emptying Of Bladder 788.21 2. 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 She was soaking at least 4 pads a day prior. I will see her on a p.r.n. basis. I will rule out urinary tract infection prior to retreatment.  After a thorough review of the management options for the patient's condition the patient  elected to proceed with surgical therapy as noted above. We have discussed the potential benefits and risks of the procedure, side effects of the proposed treatment, the likelihood of the patient achieving the goals of the procedure, and any potential problems that might occur during the procedure or recuperation. Informed consent has been obtained.

## 2013-04-18 ENCOUNTER — Encounter (HOSPITAL_BASED_OUTPATIENT_CLINIC_OR_DEPARTMENT_OTHER): Payer: Self-pay | Admitting: *Deleted

## 2013-04-18 ENCOUNTER — Encounter (HOSPITAL_BASED_OUTPATIENT_CLINIC_OR_DEPARTMENT_OTHER): Payer: Self-pay | Admitting: Anesthesiology

## 2013-04-18 ENCOUNTER — Encounter (HOSPITAL_BASED_OUTPATIENT_CLINIC_OR_DEPARTMENT_OTHER)
Admission: RE | Disposition: A | Discharge status: Home / Self Care | Payer: Self-pay | Source: Ambulatory Visit | Attending: Urology

## 2013-04-18 ENCOUNTER — Ambulatory Visit (HOSPITAL_BASED_OUTPATIENT_CLINIC_OR_DEPARTMENT_OTHER): Payer: Medicare Other | Admitting: Anesthesiology

## 2013-04-18 ENCOUNTER — Ambulatory Visit (HOSPITAL_BASED_OUTPATIENT_CLINIC_OR_DEPARTMENT_OTHER)
Admission: RE | Admit: 2013-04-18 | Discharge: 2013-04-18 | Disposition: A | Discharge status: Home / Self Care | Payer: Medicare Other | Source: Ambulatory Visit | Attending: Urology | Admitting: Urology

## 2013-04-18 DIAGNOSIS — E7800 Pure hypercholesterolemia, unspecified: Secondary | ICD-10-CM | POA: Insufficient documentation

## 2013-04-18 DIAGNOSIS — IMO0001 Reserved for inherently not codable concepts without codable children: Secondary | ICD-10-CM | POA: Insufficient documentation

## 2013-04-18 DIAGNOSIS — N319 Neuromuscular dysfunction of bladder, unspecified: Secondary | ICD-10-CM | POA: Insufficient documentation

## 2013-04-18 DIAGNOSIS — K219 Gastro-esophageal reflux disease without esophagitis: Secondary | ICD-10-CM | POA: Insufficient documentation

## 2013-04-18 DIAGNOSIS — N3289 Other specified disorders of bladder: Secondary | ICD-10-CM | POA: Insufficient documentation

## 2013-04-18 DIAGNOSIS — N3946 Mixed incontinence: Secondary | ICD-10-CM | POA: Insufficient documentation

## 2013-04-18 DIAGNOSIS — G4730 Sleep apnea, unspecified: Secondary | ICD-10-CM | POA: Insufficient documentation

## 2013-04-18 DIAGNOSIS — Z90710 Acquired absence of both cervix and uterus: Secondary | ICD-10-CM | POA: Insufficient documentation

## 2013-04-18 DIAGNOSIS — N189 Chronic kidney disease, unspecified: Secondary | ICD-10-CM | POA: Insufficient documentation

## 2013-04-18 DIAGNOSIS — Z79899 Other long term (current) drug therapy: Secondary | ICD-10-CM | POA: Insufficient documentation

## 2013-04-18 HISTORY — PX: CYSTOSCOPY WITH INJECTION: SHX1424

## 2013-04-18 HISTORY — DX: Nontoxic goiter, unspecified: E04.9

## 2013-04-18 HISTORY — DX: Personal history of diseases of the skin and subcutaneous tissue: Z87.2

## 2013-04-18 LAB — POCT I-STAT 4, (NA,K, GLUC, HGB,HCT)
Glucose, Bld: 103 mg/dL — ABNORMAL HIGH (ref 70–99)
HCT: 37 % (ref 36.0–46.0)
Hemoglobin: 12.6 g/dL (ref 12.0–15.0)
Sodium: 141 mEq/L (ref 135–145)

## 2013-04-18 SURGERY — CYSTOSCOPY, WITH INJECTION OF BLADDER NECK OR BLADDER WALL
Anesthesia: General | Site: Bladder | Wound class: Clean Contaminated

## 2013-04-18 MED ORDER — ONABOTULINUMTOXINA 100 UNITS IJ SOLR
INTRAMUSCULAR | Status: DC | PRN
  Administered 2013-04-18: 100 [IU] via INTRAMUSCULAR

## 2013-04-18 MED ORDER — STERILE WATER FOR IRRIGATION IR SOLN
Status: DC | PRN
  Administered 2013-04-18: 3000 mL via INTRAVESICAL

## 2013-04-18 MED ORDER — PROPOFOL 10 MG/ML IV BOLUS
INTRAVENOUS | Status: DC | PRN
  Administered 2013-04-18: 200 mg via INTRAVENOUS

## 2013-04-18 MED ORDER — METOCLOPRAMIDE HCL 5 MG/ML IJ SOLN
INTRAMUSCULAR | Status: DC | PRN
  Administered 2013-04-18: 10 mg via INTRAVENOUS

## 2013-04-18 MED ORDER — LIDOCAINE HCL (CARDIAC) 20 MG/ML IV SOLN
INTRAVENOUS | Status: DC | PRN
  Administered 2013-04-18: 80 mg via INTRAVENOUS

## 2013-04-18 MED ORDER — DEXAMETHASONE SODIUM PHOSPHATE 4 MG/ML IJ SOLN
INTRAMUSCULAR | Status: DC | PRN
  Administered 2013-04-18: 10 mg via INTRAVENOUS

## 2013-04-18 MED ORDER — MEPERIDINE HCL 25 MG/ML IJ SOLN
6.2500 mg | INTRAMUSCULAR | Status: DC | PRN
  Filled 2013-04-18: qty 1

## 2013-04-18 MED ORDER — PROMETHAZINE HCL 25 MG/ML IJ SOLN
6.2500 mg | INTRAMUSCULAR | Status: DC | PRN
  Filled 2013-04-18: qty 1

## 2013-04-18 MED ORDER — ONDANSETRON HCL 4 MG/2ML IJ SOLN
INTRAMUSCULAR | Status: DC | PRN
  Administered 2013-04-18: 4 mg via INTRAVENOUS

## 2013-04-18 MED ORDER — SCOPOLAMINE 1 MG/3DAYS TD PT72
1.0000 | MEDICATED_PATCH | TRANSDERMAL | Status: DC
  Filled 2013-04-18: qty 1

## 2013-04-18 MED ORDER — ACETAMINOPHEN 10 MG/ML IV SOLN
1000.0000 mg | Freq: Once | INTRAVENOUS | Status: DC | PRN
  Filled 2013-04-18: qty 100

## 2013-04-18 MED ORDER — HYDROMORPHONE HCL PF 1 MG/ML IJ SOLN
0.2500 mg | INTRAMUSCULAR | Status: DC | PRN
  Filled 2013-04-18: qty 1

## 2013-04-18 MED ORDER — FENTANYL CITRATE 0.05 MG/ML IJ SOLN
INTRAMUSCULAR | Status: DC | PRN
  Administered 2013-04-18 (×2): 50 ug via INTRAVENOUS

## 2013-04-18 MED ORDER — KETOROLAC TROMETHAMINE 30 MG/ML IJ SOLN
INTRAMUSCULAR | Status: DC | PRN
  Administered 2013-04-18: 30 mg via INTRAVENOUS

## 2013-04-18 MED ORDER — EPHEDRINE SULFATE 50 MG/ML IJ SOLN
INTRAMUSCULAR | Status: DC | PRN
  Administered 2013-04-18 (×2): 10 mg via INTRAVENOUS

## 2013-04-18 MED ORDER — LACTATED RINGERS IV SOLN
INTRAVENOUS | Status: DC
  Administered 2013-04-18: 100 mL/h via INTRAVENOUS
  Filled 2013-04-18: qty 1000

## 2013-04-18 SURGICAL SUPPLY — 19 items
BAG DRAIN URO-CYSTO SKYTR STRL (DRAIN) ×2 IMPLANT
CANISTER SUCT LVC 12 LTR MEDI- (MISCELLANEOUS) ×2 IMPLANT
CATH ROBINSON RED A/P 12FR (CATHETERS) IMPLANT
CLOTH BEACON ORANGE TIMEOUT ST (SAFETY) ×2 IMPLANT
DRAPE CAMERA CLOSED 9X96 (DRAPES) ×2 IMPLANT
ELECT REM PT RETURN 9FT ADLT (ELECTROSURGICAL)
ELECTRODE REM PT RTRN 9FT ADLT (ELECTROSURGICAL) IMPLANT
GLOVE BIO SURGEON STRL SZ7.5 (GLOVE) ×2 IMPLANT
GLOVE ECLIPSE 7.0 STRL STRAW (GLOVE) ×2 IMPLANT
GLOVE INDICATOR 7.5 STRL GRN (GLOVE) ×2 IMPLANT
GOWN PREVENTION PLUS LG XLONG (DISPOSABLE) ×2 IMPLANT
GOWN STRL REIN XL XLG (GOWN DISPOSABLE) ×2 IMPLANT
NDL SAFETY ECLIPSE 18X1.5 (NEEDLE) ×2 IMPLANT
NEEDLE HYPO 18GX1.5 SHARP (NEEDLE) ×2
PACK CYSTOSCOPY (CUSTOM PROCEDURE TRAY) ×2 IMPLANT
SYR 20CC LL (SYRINGE) ×4 IMPLANT
SYR 30ML LL (SYRINGE) ×4 IMPLANT
SYR BULB IRRIGATION 50ML (SYRINGE) IMPLANT
WATER STERILE IRR 3000ML UROMA (IV SOLUTION) ×2 IMPLANT

## 2013-04-18 NOTE — Anesthesia Preprocedure Evaluation (Signed)
Anesthesia Evaluation  Patient identified by MRN, date of birth, ID band Patient awake    Reviewed: Allergy & Precautions, H&P , NPO status , Patient's Chart, lab work & pertinent test results  History of Anesthesia Complications (+) PONV  Airway Mallampati: II TM Distance: <3 FB Neck ROM: Full    Dental no notable dental hx. (+) Dental Advisory Given   Pulmonary sleep apnea ,  breath sounds clear to auscultation  Pulmonary exam normal       Cardiovascular - Peripheral Vascular Disease negative cardio ROS  Rhythm:Regular Rate:Normal     Neuro/Psych PSYCHIATRIC DISORDERS Anxiety Depression negative neurological ROS  negative psych ROS   GI/Hepatic Neg liver ROS, GERD-  Medicated,  Endo/Other  negative endocrine ROS  Renal/GU negative Renal ROS     Musculoskeletal  (+) Fibromyalgia -  Abdominal   Peds  Hematology negative hematology ROS (+)   Anesthesia Other Findings   Reproductive/Obstetrics negative OB ROS                           Anesthesia Physical  Anesthesia Plan  ASA: III  Anesthesia Plan: General   Post-op Pain Management:    Induction: Intravenous  Airway Management Planned: LMA  Additional Equipment:   Intra-op Plan:   Post-operative Plan: Extubation in OR  Informed Consent: I have reviewed the patients History and Physical, chart, labs and discussed the procedure including the risks, benefits and alternatives for the proposed anesthesia with the patient or authorized representative who has indicated his/her understanding and acceptance.   Dental advisory given  Plan Discussed with: CRNA  Anesthesia Plan Comments:         Anesthesia Quick Evaluation

## 2013-04-18 NOTE — Transfer of Care (Signed)
Immediate Anesthesia Transfer of Care Note  Patient: Diana Carroll  Procedure(s) Performed: Procedure(s) (LRB): CYSTOSCOPY WITH BOTOX INJECTION (N/A)  Patient Location: PACU  Anesthesia Type: General  Level of Consciousness: awake, alert  and oriented  Airway & Oxygen Therapy: Patient Spontanous Breathing and Patient connected to face mask oxygen  Post-op Assessment: Report given to PACU RN and Post -op Vital signs reviewed and stable  Post vital signs: Reviewed and stable  Complications: No apparent anesthesia complications

## 2013-04-18 NOTE — H&P (Signed)
History of Present Illness   I was consulted by Dr. Delrae Alfred regarding Diana Carroll's vesicovaginal fistula.   Based upon a review of medical records and her history, it sounds like she had significant prolapse managed with a pessary that was doing very well for approximately 4 years ago. Approximately 3 months ago, he started having severe incontinence and mainly leakage not associated with enuresis. She has enuresis as well, but not as bad when she is supine. She does not report significant stress or urge incontinence. Several months ago, she had mild incontinence wearing 1 pad a day. She was voiding every 2-3 hours and getting up 4 times a night and reporting a good flow.   She denies a history of kidney stones and urinary tract infections and has not had previous GU surgery.   She has no neurologic risk factors or symptoms. She has had a hysterectomy. She has a little bit of a problem with diarrhea.   Her incontinence is severe. There is no other modifying factors or associated signs or symptoms. There is no other aggravating or relieving factors.   I reviewed her medical records. It sounds like she had a distal right ureteral stone. She had cystoscopy and approximately 4 mm of a pessary was seen eroding into the bladder. She may have had diverticula in the mid or proximal right ureter. A CT without dye on December 6 demonstrated normal kidneys based upon the history. Apparently she had a negative cystogram looking for a fistula. She has a history of chronic renal insufficiency. She has had documented urinary tract infections based upon her history.    Current Meds 1. Caltrate Plus TABS; Therapy: (Recorded:13Mar2014) to 2. ClonazePAM 1 MG Oral Tablet; Therapy: (Recorded:13Mar2014) to 3. Diltiazem HCl ER 240 MG Oral Capsule Extended Release 24 Hour; Therapy:  (Recorded:13Mar2014) to 4. Ferrous Sulfate TABS; Therapy: (Recorded:13Mar2014) to 5. Levothyroxine Sodium 50 MCG Oral Tablet;  Therapy: (Recorded:13Mar2014) to 6. Lexapro 10 MG Oral Tablet; Therapy: (Recorded:13Mar2014) to 7. Magnesium CAPS; Therapy: (Recorded:13Mar2014) to 8. Multi Vitamin/Minerals TABS; Therapy: (Recorded:13Mar2014) to 9. Omeprazole 40 MG Oral Capsule Delayed Release; Therapy: (Recorded:13Mar2014) to 10. Perindopril Erbumine 4 MG Oral Tablet; Therapy: (Recorded:13Mar2014) to 11. Vitamin A TABS; Therapy: (Recorded:13Mar2014) to  Family History Problems  1. Family history of  Death In The Family Father Died at age 54 2. Family history of  Death In The Family Mother Died at age of 54 due to leukemia 3. Family history of  Family Health Status Number Of Children Patient has 2 daughters.  Review of Systems Skin, eye, otolaryngeal, hematologic/lymphatic, cardiovascular, pulmonary, endocrine, neurological and psychiatric system(s) were reviewed and pertinent findings if present are noted.  Genitourinary: incontinence.  Gastrointestinal: diarrhea.  Constitutional: feeling tired (fatigue).  Musculoskeletal: back pain and joint pain.    Physical Exam Constitutional: Well nourished and well developed . No acute distress.  ENT:. The ears and nose are normal in appearance.  Neck: The appearance of the neck is normal and no neck mass is present.  Pulmonary: No respiratory distress and normal respiratory rhythm and effort.  Cardiovascular: Heart rate and rhythm are normal . No peripheral edema.  Abdomen: The abdomen is soft and nontender. No masses are palpated. No CVA tenderness. No hernias are palpable. No hepatosplenomegaly noted.  Lymphatics: The femoral and inguinal nodes are not enlarged or tender.  Skin: Normal skin turgor, no visible rash and no visible skin lesions.  Neuro/Psych:. Mood and affect are appropriate.   . Genitourinary: On pelvic examination,  Ms. Diana Carroll had a grade 3 cystocele that exited the introitus with a central defect.  Vaginal cuff descended from approximately 8 cm to 3 cm.   She had a negative cough test after cystoscopy with the cystocele reduced.  She did not have a significant posterior defect with the cuff and cystocele supported.  I could see a small opening likely around 4 mm in size just to the right of the midline near the bladder dome or vaginal apex.  She does have vaginal shortening.  I did not see more than one opening and I did not do a methylene blue test.     Results/Data   Today Ms. Diana Carroll underwent a number of tests, which I personally reviewed.   Bladder Scan: Bladder scan residual was 0.0 mL.   Cystoscopy: Today the patient underwent cystoscopy after discussing pros, cons and risks. The procedure was performed to assess the bladder/urethra and to rule out an intravesical cause of their symptoms. A well lubricated, sterile cystoscope was utilized and gently inserted into the urethra. The bladder mucosa and trigone were normal. There was no stitch, foreign body, or carcinoma. There was clear urine effluxing from both ureters. The procedure was well tolerated.  I ___ bladder reduced with a gauze and I could not see the gauze or the opening. I suspect the fistula is away from the ureteral orifices.    Assessment Assessed  1. Urge And Stress Incontinence 788.33 2. Enuresis Nocturnal 788.36  Plan Urge And Stress Incontinence (788.33)  1. Cysto  Done: [id number] 2. PVR U/S  Done: [id number]  Discussion/Summary   Today Ms. Diana Carroll likely had mild mixed stress urge incontinence. In the last few months she had significant incontinence not associated with awareness and enuresis. She always had moderate nocturia and mild frequency. She has been documented as having a vesicovaginal fistula and significant prolapse.   I drew Diana Carroll a picture. Her upper tracts looked normal on a CT urogram. I think it would be very reasonable to proceed with a transvaginal repair of the vesicovaginal fistula. Certainly access will be easier with her prolapse. She  should not have a simultaneous prolapse repair. I would examine her under anesthesia before proceeding with the actual surgery to make certain there is not more than one opening and even insert blue dye into the bladder if necessary. I would stop the case if the fistula was too close to the ureteral orifices.  I discussed the case in detail.   I drew her a picture and we talked about reconstructive surgery in detail. Pros, cons, general surgical and anesthetic risks, and other options including watchful waiting were discussed. She understands that surgery is successful in approximately 85% of cases. Failure rates and the risk of persistence/recurrence/and worsening of the problem were discussed. Surgical risks were described but not limited to the discussion of injury to neighboring structures including the bowel (with possible life-threatening sepsis and colostomy), bladder, urethra, vagina (all resulting in further surgery), and ureter (resulting in re-implantation). We talked about injury to nerves/soft tissue leading to debilitating and intractable pelvic, abdominal, and lower extremity pain syndromes and neuropathies. The risks of dyspareunia, vaginal narrowing and shortening with sequelae were discussed. The risk of persistent, de novo, or worsening incontinence/dysfunction was discussed. Bleeding risks, transfusion rates, and infection were discussed. The usual post-operative course was described. The patient understands that she might not reach her treatment goal and that she might be worse following surgery.  I am going to send a  copy of my note to Dr. Delrae Alfred, Urologist.  cc: Delrae Alfred, MD   I think it would be important to give Diana Carroll 3 days of ciprofloxacin prior to her surgery, since she will be prone to urinary tract infections and she was here to have a urine culture approximately 10 prior.  After a thorough review of the management options for the patient's condition the  patient  elected to proceed with surgical therapy as noted above. We have discussed the potential benefits and risks of the procedure, side effects of the proposed treatment, the likelihood of the patient achieving the goals of the procedure, and any potential problems that might occur during the procedure or recuperation. Informed consent has been obtained.

## 2013-04-18 NOTE — Anesthesia Procedure Notes (Signed)
Procedure Name: LMA Insertion Date/Time: 04/18/2013 7:41 AM Performed by: Mechele Claude Pre-anesthesia Checklist: Patient identified, Emergency Drugs available, Suction available and Patient being monitored Patient Re-evaluated:Patient Re-evaluated prior to inductionOxygen Delivery Method: Circle System Utilized Preoxygenation: Pre-oxygenation with 100% oxygen Intubation Type: IV induction Ventilation: Mask ventilation without difficulty LMA: LMA inserted LMA Size: 4.0 Number of attempts: 1 Airway Equipment and Method: bite block Placement Confirmation: positive ETCO2 Tube secured with: Tape Dental Injury: Teeth and Oropharynx as per pre-operative assessment

## 2013-04-18 NOTE — Interval H&P Note (Signed)
History and Physical Interval Note:  04/18/2013 7:25 AM  Diana Carroll  has presented today for surgery, with the diagnosis of NEUROGENIC BLADDER  The various methods of treatment have been discussed with the patient and family. After consideration of risks, benefits and other options for treatment, the patient has consented to  [name] as a surgical intervention .  The patient's history has been reviewed, patient examined, no change in status, stable for surgery.  I have reviewed the patient's chart and labs.  Questions were answered to the patient's satisfaction.     Cheyane Ayon A

## 2013-04-18 NOTE — Op Note (Signed)
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 Nicki Reaper Esparanza Krider  The patient has the above diagnoses and consented the above procedure. The usual antibiotic protocol was performed for her. Leg position was excellent.  ACMI scope was utilized. She grade 2-4 bladder trabeculation. There is no cystitis. Urethra looked normal. With the injection scope I injected 200 cc of Botox in 20 cc and normal saline at 5 and 7:00 and cephalad to the interureteric ridge utilizing my usual template. The trigone was spared. There was no bleeding.  Bladder was emptied and patient taken to recover room

## 2013-04-18 NOTE — Anesthesia Postprocedure Evaluation (Signed)
Anesthesia Post Note  Patient: Diana Carroll  Procedure(s) Performed: Procedure(s) (LRB): CYSTOSCOPY WITH BOTOX INJECTION (N/A)  Anesthesia type: General  Patient location: PACU  Post pain: Pain level controlled  Post assessment: Post-op Vital signs reviewed  Last Vitals: BP 114/56  Pulse 73  Temp(Src) 36.4 C (Oral)  Resp 12  Ht 5' 4" (1.626 m)  Wt 217 lb (98.431 kg)  BMI 37.23 kg/m2  SpO2 100%  Post vital signs: Reviewed  Level of consciousness: sedated  Complications: No apparent anesthesia complications

## 2013-04-19 ENCOUNTER — Encounter (HOSPITAL_BASED_OUTPATIENT_CLINIC_OR_DEPARTMENT_OTHER): Payer: Self-pay | Admitting: Urology

## 2014-07-27 ENCOUNTER — Other Ambulatory Visit: Payer: Self-pay | Admitting: Urology

## 2014-07-31 HISTORY — PX: OTHER SURGICAL HISTORY: SHX169

## 2014-08-08 ENCOUNTER — Encounter (HOSPITAL_BASED_OUTPATIENT_CLINIC_OR_DEPARTMENT_OTHER): Payer: Self-pay | Admitting: *Deleted

## 2014-08-08 NOTE — Progress Notes (Signed)
NPO AFTER MN. ARRIVE AT 0930. NEEDS HG. WILL TAKE AM MEDS W/ DIPS OF WATER DOS.

## 2014-08-11 NOTE — H&P (Signed)
History of Present Illness   Diana Carroll had breast cancer and has finished chemotherapy. She keeps getting pubic bone and right upper thigh cellulitis as soon as they stop her amoxicillin. She has been to the hospital at [facility] many times over the last year and it has been a real puzzle to infectious disease. Her incontinence has been worse and severe for multiple months. She still performs intermittent self catheterization. She is almost dry post Botox.   There are no other modifying factors or associated signs or symptoms. There are no other aggravating or relieving factors. The presentation is moderate to severe in severity and persisting.   She actually had Streptococcus in her blood in the past.  She is allergic to sulfa.   Review of Systems: No other change in bowel or neurologic systems.   She has stable and complete bladder emptying and neurogenic bladder.    Past Medical History Problems  1. History of Anxiety (Symptom) (799.2) 2. History of Cervical Cancer (V10.41) 3. History of Fibromyalgia (729.1) 4. History of depression (V11.8) 5. History of esophageal reflux (V12.79) 6. History of hypercholesterolemia (V12.29) 7. History of sleep apnea (V13.89) 8. History of Lymphedema (457.1) 9. History of Pulmonary Hypertension (416.0)  Surgical History Problems  1. History of Appendectomy 2. History of Bladder Surgery 3. History of Electronic Bladder Stimulator 4. History of Endoscopic Injection Of Implant Material Into Bladder Neck 5. History of Foot Surgery 6. History of Hysterectomy 7. History of Inguinal Hernia Repair 8. History of Oophorectomy - Bilat (Removal Of Both Ovaries) Laparoscopic 9. History of Peripheral Nerve Neurostimulator Removal Of Electrodes 10. History of Peripheral Nerve Neurostimulator Removal Of Pulse Generator 11. History of Tubal Ligation 12. History of Urologic Surgery 13. History of Urologic Surgery 14. History of Vaginal Surgery  Colpotomy  Current Meds 1. Amitriptyline HCl - 25 MG Oral Tablet;  Therapy: [id number] to Recorded 2. Amoxicillin 250 MG Oral Capsule;  Therapy: (Recorded:19Aug2015) to Recorded 3. CeleBREX 200 MG Oral Capsule;  Therapy: (Recorded:20Dec2007) to Recorded 4. Cymbalta CPEP;  Therapy: (Recorded:18May2011) to Recorded 5. Elavil 50 MG TABS;  Therapy: (Recorded:20Dec2007) to Recorded 6. Furosemide 20 MG Oral Tablet;  Therapy: [id number] to Recorded 7. Hydrocodone-Acetaminophen 7.5-325 MG Oral Tablet;  Therapy: 30Dec2011 to Recorded 8. Lyrica 150 MG Oral Capsule;  Therapy: (Recorded:18May2011) to Recorded 9. Pantoprazole Sodium 40 MG Oral Tablet Delayed Release;  Therapy: [id number] to Recorded 10. Protonix 40 MG Intravenous Solution Reconstituted;   Therapy: (Recorded:20Dec2007) to Recorded 11. Soma 350 MG Oral Tablet;   Therapy: (Recorded:18May2011) to Recorded 12. Tracleer 62.5 MG Oral Tablet;   Therapy: [id number] to Recorded 13. Tyvaso 0.6 MG/ML Inhalation Solution;   Therapy: (Recorded:22May2012) to Recorded 14. Ultram TABS;   Therapy: (Recorded:19Aug2015) to Recorded 15. Vicodin ES 7.5-750 MG TABS;   Therapy: (Recorded:20Dec2007) to Recorded 16. Zantac CAPS;   Therapy: (Recorded:19Aug2015) to Recorded 17. Zolpidem Tartrate 10 MG Oral Tablet;   Therapy: [id number] to Recorded  Allergies Medication  1. Heparin 2. Morphine Derivatives 3. Sudafed Plus TABS 4. Sulfa Drugs  Family History Problems  1. Family history of Acute Myocardial Infarction : Mother 2. Family history of Family Health Status Number Of Children   2 daughters 3. Family history of hypertension (V17.49)   Brothers 4. Family history of Lung Cancer (V16.1) : Father  Social History Problems  1. Denied: Alcohol Use 2. Caffeine Use   Less than 1 per day 3. Family history of Death In The Family Father   Age 76 4. History of  Housing DME CPAP 5. Marital History - Currently Married 6. Never A Smoker 7.  Occupation:   Disabled Marine scientist 8. Denied: Tobacco Use (V15.82)  Vitals Vital Signs [Data Includes: Last 1 Day]  Recorded: 19Aug2015 01:56PM  Blood Pressure: 134 / 79 Temperature: 98.2 F Heart Rate: 76  Results/Data  Urine [Data Includes: Last 1 Day]   [id number]  COLOR YELLOW   APPEARANCE CLEAR   SPECIFIC GRAVITY <1.005   pH 7.0   GLUCOSE NEG mg/dL  BILIRUBIN NEG   KETONE NEG mg/dL  BLOOD NEG   PROTEIN NEG mg/dL  UROBILINOGEN 0.2 mg/dL  NITRITE NEG   LEUKOCYTE ESTERASE NEG    Assessment Assessed  1. Incomplete bladder emptying (788.21) 2. Urge and stress incontinence (788.33)  Plan Neurogenic bladder  1. Start: Ciprofloxacin HCl - 250 MG Oral Tablet; TAKE 1 TABLET BID 2. Follow-up Schedule Surgery Office  Follow-up  Status: Hold For - Appointment   Requested for: [phone] Unlinked  3. Stop: Elavil 50 MG TABS (Amitriptyline HCl) 4. Stop: Furosemide 20 MG Oral Tablet 5. Stop: Pantoprazole Sodium 40 MG Oral Tablet Delayed Release 6. Stop: Protonix 40 MG Intravenous Solution Reconstituted (Pantoprazole Sodium) 7. Stop: Vicodin ES 7.5-750 MG TABS (Hydrocodone-Acetaminophen)  Discussion/Summary   Urine culture will be obtained 10 days prior. Three days of Cipro to start 3 days prior given. She will be scheduled for Botox and her last treatment was May 2014 and I utilized 200 units.   She gave her a ciprofloxacin prescription for a week so she would have medication _____  After a thorough review of the management options for the patient's condition the patient  elected to proceed with surgical therapy as noted above. We have discussed the potential benefits and risks of the procedure, side effects of the proposed treatment, the likelihood of the patient achieving the goals of the procedure, and any potential problems that might occur during the procedure or recuperation. Informed consent has been obtained.

## 2014-08-15 ENCOUNTER — Encounter (HOSPITAL_BASED_OUTPATIENT_CLINIC_OR_DEPARTMENT_OTHER)
Admission: RE | Disposition: A | Discharge status: Home / Self Care | Payer: Self-pay | Source: Ambulatory Visit | Attending: Urology

## 2014-08-15 ENCOUNTER — Ambulatory Visit (HOSPITAL_BASED_OUTPATIENT_CLINIC_OR_DEPARTMENT_OTHER)
Admission: RE | Admit: 2014-08-15 | Discharge: 2014-08-15 | Disposition: A | Discharge status: Home / Self Care | Payer: Medicare Other | Source: Ambulatory Visit | Attending: Urology | Admitting: Urology

## 2014-08-15 ENCOUNTER — Ambulatory Visit (HOSPITAL_BASED_OUTPATIENT_CLINIC_OR_DEPARTMENT_OTHER): Payer: Medicare Other | Admitting: Anesthesiology

## 2014-08-15 ENCOUNTER — Encounter (HOSPITAL_BASED_OUTPATIENT_CLINIC_OR_DEPARTMENT_OTHER): Payer: Self-pay

## 2014-08-15 ENCOUNTER — Encounter (HOSPITAL_BASED_OUTPATIENT_CLINIC_OR_DEPARTMENT_OTHER): Payer: Medicare Other | Admitting: Anesthesiology

## 2014-08-15 DIAGNOSIS — Z853 Personal history of malignant neoplasm of breast: Secondary | ICD-10-CM | POA: Diagnosis not present

## 2014-08-15 DIAGNOSIS — K219 Gastro-esophageal reflux disease without esophagitis: Secondary | ICD-10-CM | POA: Insufficient documentation

## 2014-08-15 DIAGNOSIS — Z882 Allergy status to sulfonamides status: Secondary | ICD-10-CM | POA: Insufficient documentation

## 2014-08-15 DIAGNOSIS — N319 Neuromuscular dysfunction of bladder, unspecified: Secondary | ICD-10-CM | POA: Insufficient documentation

## 2014-08-15 DIAGNOSIS — IMO0001 Reserved for inherently not codable concepts without codable children: Secondary | ICD-10-CM | POA: Diagnosis not present

## 2014-08-15 DIAGNOSIS — G4730 Sleep apnea, unspecified: Secondary | ICD-10-CM | POA: Insufficient documentation

## 2014-08-15 DIAGNOSIS — E7800 Pure hypercholesterolemia, unspecified: Secondary | ICD-10-CM | POA: Diagnosis not present

## 2014-08-15 DIAGNOSIS — N3941 Urge incontinence: Secondary | ICD-10-CM | POA: Insufficient documentation

## 2014-08-15 DIAGNOSIS — F329 Major depressive disorder, single episode, unspecified: Secondary | ICD-10-CM | POA: Insufficient documentation

## 2014-08-15 DIAGNOSIS — F3289 Other specified depressive episodes: Secondary | ICD-10-CM | POA: Insufficient documentation

## 2014-08-15 DIAGNOSIS — L03119 Cellulitis of unspecified part of limb: Secondary | ICD-10-CM | POA: Diagnosis not present

## 2014-08-15 DIAGNOSIS — L02419 Cutaneous abscess of limb, unspecified: Secondary | ICD-10-CM | POA: Insufficient documentation

## 2014-08-15 DIAGNOSIS — Z9221 Personal history of antineoplastic chemotherapy: Secondary | ICD-10-CM | POA: Diagnosis not present

## 2014-08-15 DIAGNOSIS — F411 Generalized anxiety disorder: Secondary | ICD-10-CM | POA: Diagnosis not present

## 2014-08-15 DIAGNOSIS — N3289 Other specified disorders of bladder: Secondary | ICD-10-CM | POA: Diagnosis not present

## 2014-08-15 DIAGNOSIS — I2789 Other specified pulmonary heart diseases: Secondary | ICD-10-CM | POA: Diagnosis not present

## 2014-08-15 HISTORY — DX: Personal history of antineoplastic chemotherapy: Z92.21

## 2014-08-15 HISTORY — DX: Other specified health status: Z78.9

## 2014-08-15 HISTORY — DX: Lymphedema, not elsewhere classified: I89.0

## 2014-08-15 HISTORY — PX: CYSTOSCOPY WITH INJECTION: SHX1424

## 2014-08-15 LAB — POCT HEMOGLOBIN-HEMACUE: Hemoglobin: 15.1 g/dL — ABNORMAL HIGH (ref 12.0–15.0)

## 2014-08-15 SURGERY — CYSTOSCOPY, WITH INJECTION OF BLADDER NECK OR BLADDER WALL
Anesthesia: General | Site: Bladder

## 2014-08-15 MED ORDER — ONABOTULINUMTOXINA 100 UNITS IJ SOLR
INTRAMUSCULAR | Status: DC | PRN
  Administered 2014-08-15: 200 [IU] via INTRAMUSCULAR

## 2014-08-15 MED ORDER — MIDAZOLAM HCL 5 MG/5ML IJ SOLN
INTRAMUSCULAR | Status: DC | PRN
  Administered 2014-08-15: .5 mg via INTRAVENOUS
  Administered 2014-08-15: 1.5 mg via INTRAVENOUS

## 2014-08-15 MED ORDER — CIPROFLOXACIN IN D5W 400 MG/200ML IV SOLN
INTRAVENOUS | Status: AC
  Filled 2014-08-15: qty 200

## 2014-08-15 MED ORDER — MIDAZOLAM HCL 2 MG/2ML IJ SOLN
INTRAMUSCULAR | Status: AC
  Filled 2014-08-15: qty 2

## 2014-08-15 MED ORDER — FENTANYL CITRATE 0.05 MG/ML IJ SOLN
25.0000 ug | INTRAMUSCULAR | Status: DC | PRN
  Filled 2014-08-15: qty 1

## 2014-08-15 MED ORDER — LACTATED RINGERS IV SOLN
INTRAVENOUS | Status: DC
  Administered 2014-08-15: 10:00:00 via INTRAVENOUS
  Filled 2014-08-15: qty 1000

## 2014-08-15 MED ORDER — SODIUM CHLORIDE 0.9 % IJ SOLN
INTRAMUSCULAR | Status: DC | PRN
  Administered 2014-08-15: 20 mL via INTRAVENOUS

## 2014-08-15 MED ORDER — DEXAMETHASONE SODIUM PHOSPHATE 10 MG/ML IJ SOLN
INTRAMUSCULAR | Status: DC | PRN
  Administered 2014-08-15: 10 mg via INTRAVENOUS

## 2014-08-15 MED ORDER — HYDROCODONE-ACETAMINOPHEN 5-325 MG PO TABS
ORAL_TABLET | ORAL | Status: AC
  Filled 2014-08-15: qty 1

## 2014-08-15 MED ORDER — HYDROCODONE-ACETAMINOPHEN 5-325 MG PO TABS
1.0000 | ORAL_TABLET | Freq: Four times a day (QID) | ORAL | Status: DC | PRN
  Administered 2014-08-15: 1 via ORAL
  Filled 2014-08-15: qty 1

## 2014-08-15 MED ORDER — ONDANSETRON HCL 4 MG/2ML IJ SOLN
INTRAMUSCULAR | Status: DC | PRN
  Administered 2014-08-15: 4 mg via INTRAVENOUS

## 2014-08-15 MED ORDER — FENTANYL CITRATE 0.05 MG/ML IJ SOLN
INTRAMUSCULAR | Status: AC
Start: 2014-08-15 — End: 2014-08-15
  Filled 2014-08-15: qty 2

## 2014-08-15 MED ORDER — FENTANYL CITRATE 0.05 MG/ML IJ SOLN
INTRAMUSCULAR | Status: DC | PRN
  Administered 2014-08-15: 25 ug via INTRAVENOUS

## 2014-08-15 MED ORDER — LIDOCAINE HCL (CARDIAC) 20 MG/ML IV SOLN
INTRAVENOUS | Status: DC | PRN
  Administered 2014-08-15: 70 mg via INTRAVENOUS

## 2014-08-15 MED ORDER — HYDROCODONE-ACETAMINOPHEN 5-325 MG PO TABS: 1.0000 | ORAL_TABLET | Freq: Four times a day (QID) | ORAL | Status: DC | PRN

## 2014-08-15 MED ORDER — PROMETHAZINE HCL 25 MG/ML IJ SOLN
6.2500 mg | INTRAMUSCULAR | Status: DC | PRN
  Filled 2014-08-15: qty 1

## 2014-08-15 MED ORDER — STERILE WATER FOR IRRIGATION IR SOLN
Status: DC | PRN
  Administered 2014-08-15: 3000 mL

## 2014-08-15 MED ORDER — CIPROFLOXACIN IN D5W 400 MG/200ML IV SOLN
400.0000 mg | INTRAVENOUS | Status: AC
  Administered 2014-08-15: 400 mg via INTRAVENOUS
  Filled 2014-08-15: qty 200

## 2014-08-15 MED ORDER — PROPOFOL INFUSION 10 MG/ML OPTIME
INTRAVENOUS | Status: DC | PRN
  Administered 2014-08-15: 200 mL via INTRAVENOUS

## 2014-08-15 MED ORDER — KETOROLAC TROMETHAMINE 30 MG/ML IJ SOLN
15.0000 mg | Freq: Once | INTRAMUSCULAR | Status: DC | PRN
  Filled 2014-08-15: qty 1

## 2014-08-15 SURGICAL SUPPLY — 21 items
BAG DRAIN URO-CYSTO SKYTR STRL (DRAIN) ×3 IMPLANT
CANISTER SUCT LVC 12 LTR MEDI- (MISCELLANEOUS) ×3 IMPLANT
CATH ROBINSON RED A/P 12FR (CATHETERS) IMPLANT
CLOTH BEACON ORANGE TIMEOUT ST (SAFETY) ×3 IMPLANT
DRAPE CAMERA CLOSED 9X96 (DRAPES) ×3 IMPLANT
ELECT REM PT RETURN 9FT ADLT (ELECTROSURGICAL)
ELECTRODE REM PT RTRN 9FT ADLT (ELECTROSURGICAL) IMPLANT
GLOVE BIO SURGEON STRL SZ7.5 (GLOVE) ×6 IMPLANT
GLOVE BIOGEL M 6.5 STRL (GLOVE) ×9 IMPLANT
GLOVE BIOGEL PI IND STRL 6.5 (GLOVE) ×2 IMPLANT
GLOVE BIOGEL PI INDICATOR 6.5 (GLOVE) ×4
GOWN PREVENTION PLUS LG XLONG (DISPOSABLE) IMPLANT
GOWN STRL REIN XL XLG (GOWN DISPOSABLE) IMPLANT
GOWN STRL REUS W/TWL LRG LVL3 (GOWN DISPOSABLE) ×6 IMPLANT
GOWN STRL REUS W/TWL XL LVL3 (GOWN DISPOSABLE) ×3 IMPLANT
NDL SAFETY ECLIPSE 18X1.5 (NEEDLE) ×1 IMPLANT
NEEDLE HYPO 18GX1.5 SHARP (NEEDLE) ×2
PACK CYSTOSCOPY (CUSTOM PROCEDURE TRAY) ×3 IMPLANT
SYR 20CC LL (SYRINGE) ×3 IMPLANT
SYR BULB IRRIGATION 50ML (SYRINGE) IMPLANT
WATER STERILE IRR 3000ML UROMA (IV SOLUTION) ×3 IMPLANT

## 2014-08-15 NOTE — Transfer of Care (Signed)
Immediate Anesthesia Transfer of Care Note  Patient: Diana Carroll  Procedure(s) Performed: [name]  Patient Location: PACU  Anesthesia Type:General  Level of Consciousness: awake, alert , oriented and patient cooperative  Airway & Oxygen Therapy: Patient Spontanous Breathing and Patient connected to nasal cannula oxygen  Post-op Assessment: Report given to PACU RN and Post -op Vital signs reviewed and stable  Post vital signs: Reviewed and stable  Complications: No apparent anesthesia complications

## 2014-08-15 NOTE — Anesthesia Procedure Notes (Signed)
Procedure Name: LMA Insertion Date/Time: 08/15/2014 11:39 AM Performed by: Wanita Chamberlain Pre-anesthesia Checklist: Patient identified, Timeout performed, Emergency Drugs available, Suction available and Patient being monitored Patient Re-evaluated:Patient Re-evaluated prior to inductionOxygen Delivery Method: Circle system utilized Preoxygenation: Pre-oxygenation with 100% oxygen Intubation Type: IV induction Ventilation: Mask ventilation without difficulty LMA: LMA inserted LMA Size: 4.0 Number of attempts: 1 Airway Equipment and Method: Bite block Placement Confirmation: positive ETCO2 Tube secured with: Tape Dental Injury: Teeth and Oropharynx as per pre-operative assessment

## 2014-08-15 NOTE — Interval H&P Note (Signed)
History and Physical Interval Note:  08/15/2014 7:25 AM  Diana Carroll  has presented today for surgery, with the diagnosis of urge incontinence  The various methods of treatment have been discussed with the patient and family. After consideration of risks, benefits and other options for treatment, the patient has consented to  [name] as a surgical intervention .  The patient's history has been reviewed, patient examined, no change in status, stable for surgery.  I have reviewed the patient's chart and labs.  Questions were answered to the patient's satisfaction.     Greg Cratty A

## 2014-08-15 NOTE — Op Note (Signed)
Preoperative diagnosis:  1. Urge incontinence 2. Neurogenic bladder  Postoperative diagnosis: 1. Urge incontinence 2. Neurogenic bladder  Procedure(s): 1. Cystoscopy with botox injection (200 U)  Surgeon: Dr. Nicki Reaper MacDiarmid  Resident: Dr. Amaryllis Dyke  Anesthesia: General  Complications: None  EBL: None  Specimens: None  Intraoperative findings: Mildly trabeculated bladder, no evidence of cystitis  Indication: Urge incontinence and neurogenic bladder from prior pelvic surgery. Prior improvement with botox  Description of procedure: Patient was identified in holding and brought to the OR. She was placed supine on the table and general anesthesia was induced. She was then placed in dorsal lithotomy with all pressure points padded. She was then prepped and draped in the usual fashion and a time out was called. Cystoscopy was performed revealing the above noted findings. 200U of botox in 20cc of normal saline were injected at the 5 and 7:00 positions and cephalad to the interureteric ridge utilizing the usual template. The trigone was spared. There was no bleeding. The bladder was emptied and she was taken to recovery in stable condition.  Dr. Matilde Sprang was present and scrubbed for the entire procedure.

## 2014-08-15 NOTE — Anesthesia Postprocedure Evaluation (Signed)
  Anesthesia Post-op Note  Patient: Diana Carroll  Procedure(s) Performed: Procedure(s) (LRB): CYSTOSCOPY WITH BOTOX INJECTION (N/A)  Patient Location: PACU  Anesthesia Type: General  Level of Consciousness: awake and alert   Airway and Oxygen Therapy: Patient Spontanous Breathing  Post-op Pain: mild  Post-op Assessment: Post-op Vital signs reviewed, Patient's Cardiovascular Status Stable, Respiratory Function Stable, Patent Airway and No signs of Nausea or vomiting  Last Vitals:  Filed Vitals:   08/15/14 1215  BP: 120/76  Pulse: 59  Temp:   Resp: 12    Post-op Vital Signs: stable   Complications: No apparent anesthesia complications

## 2014-08-15 NOTE — Anesthesia Preprocedure Evaluation (Signed)
Anesthesia Evaluation  Patient identified by MRN, date of birth, ID band Patient awake    Reviewed: Allergy & Precautions, H&P , NPO status , Patient's Chart, lab work & pertinent test results  Airway Mallampati: II TM Distance: >3 FB Neck ROM: Full    Dental no notable dental hx.    Pulmonary sleep apnea and Continuous Positive Airway Pressure Ventilation ,  Pulmonary hypertension, primary DX 2001---  STAGE NYHA FUNCTIONAL CLASS II MONTIOR BY DR Kathi Ludwig -- PULMOLOGIST AT [facility] --- LOV NOTE AND CXR REQUESTED #[id number]  (03-31-2013 AND CXR 03-04-2013 W/ CHART)  breath sounds clear to auscultation  Pulmonary exam normal       Cardiovascular negative cardio ROS  Rhythm:Regular Rate:Normal     Neuro/Psych negative neurological ROS  negative psych ROS   GI/Hepatic Neg liver ROS, GERD-  Medicated,  Endo/Other  negative endocrine ROS  Renal/GU negative Renal ROS  negative genitourinary   Musculoskeletal  (+) Fibromyalgia -  Abdominal   Peds negative pediatric ROS (+)  Hematology negative hematology ROS (+)   Anesthesia Other Findings   Reproductive/Obstetrics negative OB ROS                           Anesthesia Physical Anesthesia Plan  ASA: III  Anesthesia Plan: General   Post-op Pain Management:    Induction: Intravenous  Airway Management Planned: LMA  Additional Equipment:   Intra-op Plan:   Post-operative Plan:   Informed Consent: I have reviewed the patients History and Physical, chart, labs and discussed the procedure including the risks, benefits and alternatives for the proposed anesthesia with the patient or authorized representative who has indicated his/her understanding and acceptance.   Dental advisory given  Plan Discussed with: CRNA and Surgeon  Anesthesia Plan Comments:         Anesthesia Quick Evaluation

## 2014-08-15 NOTE — Discharge Instructions (Signed)
I have reviewed discharge instructions in detail with the patient. They will follow-up with me or their physician as scheduled. My nurse will also be calling the patients as per protocol.  Post Anesthesia Home Care Instructions  Activity: Get plenty of rest for the remainder of the day. A responsible adult should stay with you for 24 hours following the procedure.  For the next 24 hours, DO NOT: -Drive a car -Paediatric nurse -Drink alcoholic beverages -Take any medication unless instructed by your physician -Make any legal decisions or sign important papers.  Meals: Start with liquid foods such as gelatin or soup. Progress to regular foods as tolerated. Avoid greasy, spicy, heavy foods. If nausea and/or vomiting occur, drink only clear liquids until the nausea and/or vomiting subsides. Call your physician if vomiting continues.  Special Instructions/Symptoms: Your throat may feel dry or sore from the anesthesia or the breathing tube placed in your throat during surgery. If this causes discomfort, gargle with warm salt water. The discomfort should disappear within 24 hours. CYSTOSCOPY HOME CARE INSTRUCTIONS  Activity: Rest for the remainder of the day.  Do not drive or operate equipment today.  You may resume normal activities in one to two days as instructed by your physician.   Meals: Drink plenty of liquids and eat light foods such as gelatin or soup this evening.  You may return to a normal meal plan tomorrow.  Return to Work: You may return to work in one to two days or as instructed by your physician.  Special Instructions / Symptoms: Call your physician if any of these symptoms occur:   -persistent or heavy bleeding  -bleeding which continues after first few urination  -large blood clots that are difficult to pass  -urine stream diminishes or stops completely  -fever equal to or higher than 101 degrees Farenheit.  -cloudy urine with a strong, foul odor  -severe  pain  Females should always wipe from front to back after elimination.  You may feel some burning pain when you urinate.  This should disappear with time.  Applying moist heat to the lower abdomen or a hot tub bath may help relieve the pain. \  Follow-Up / Date of Return Visit to Your Physician:  *** Call for an appointment to arrange follow-up.  Patient Signature:  ________________________________________________________  Nurse's Signature:  ________________________________________________________

## 2014-08-16 ENCOUNTER — Encounter (HOSPITAL_BASED_OUTPATIENT_CLINIC_OR_DEPARTMENT_OTHER): Payer: Self-pay | Admitting: Urology

## 2015-05-11 ENCOUNTER — Other Ambulatory Visit: Payer: Self-pay | Admitting: Urology

## 2015-06-01 ENCOUNTER — Encounter (HOSPITAL_BASED_OUTPATIENT_CLINIC_OR_DEPARTMENT_OTHER): Payer: Self-pay | Admitting: *Deleted

## 2015-06-01 NOTE — Progress Notes (Addendum)
NPO AFTER MN.  ARRIVE AT 0715.  NEEDS HG.  CURRENT CXR AND LAST PULMOLOGIST NOTE TO BE FAXED FROM [facility].  WILL TAKE HYDROCODONE, TRAMADOL, AND DO TYVASO INHALER AM DOS W/ SIPS OF WATER.  RECEIVED ALL INFO FROM [facility] AND PLACED ON CHART.

## 2015-06-04 ENCOUNTER — Encounter (HOSPITAL_BASED_OUTPATIENT_CLINIC_OR_DEPARTMENT_OTHER): Payer: Self-pay | Admitting: *Deleted

## 2015-06-04 NOTE — H&P (Signed)
History of Present Illness   Diana Diana Carroll has incomplete bladder emptying, refractory urge incontinence, and neurogenic bladder. The last Botox was in September 2015 utilizing 200 units. When she was self-catheterizing with volumes approximately 800 mL she actually stayed quite dry.  Review of systems: No change in bowel or neurologic systems.    Past Medical History Problems  1. History of Anxiety (Symptom) 2. History of Cervical Cancer 3. History of Fibromyalgia (M79.7) 4. History of depression (Z86.59) 5. History of esophageal reflux (Z87.19) 6. History of hypercholesterolemia (Z86.39) 7. History of sleep apnea (Z87.09) 8. History of Lymphedema (I89.0) 9. History of Pulmonary Hypertension  Surgical History Problems  1. History of Appendectomy 2. History of Bladder Surgery 3. History of Electronic Bladder Stimulator 4. History of Endoscopic Injection Of Implant Material Into Bladder Neck 5. History of Foot Surgery 6. History of Hysterectomy 7. History of Inguinal Hernia Repair 8. History of Oophorectomy - Bilat (Removal Of Both Ovaries) Laparoscopic 9. History of Peripheral Nerve Neurostimulator Removal Of Electrodes 10. History of Peripheral Nerve Neurostimulator Removal Of Pulse Generator 11. History of Tubal Ligation 12. History of Urologic Surgery 13. History of Urologic Surgery 14. History of Urologic Surgery 15. History of Vaginal Surgery Colpotomy  Current Meds 1. Amitriptyline HCl - 25 MG Oral Tablet;  Therapy: [id number] to Recorded 2. Amoxicillin 250 MG Oral Capsule;  Therapy: (Recorded:19Aug2015) to Recorded 3. Cymbalta CPEP;  Therapy: (Recorded:18May2011) to Recorded 4. Hydrocodone-Acetaminophen 7.5-325 MG Oral Tablet;  Therapy: 30Dec2011 to Recorded 5. Lyrica 150 MG Oral Capsule;  Therapy: (Recorded:18May2011) to Recorded 6. Soma 350 MG Oral Tablet;  Therapy: (Recorded:18May2011) to Recorded 7. Tracleer 62.5 MG Oral Tablet;  Therapy: 45GYB6389 to  Recorded 8. Tyvaso 0.6 MG/ML Inhalation Solution;  Therapy: (Recorded:22May2012) to Recorded 9. Ultram TABS;  Therapy: (Recorded:19Aug2015) to Recorded 10. Zantac CAPS;   Therapy: (Recorded:19Aug2015) to Recorded 11. Zolpidem Tartrate 10 MG Oral Tablet;   Therapy: 37DSK8768 to Recorded  Allergies Medication  1. Heparin 2. Morphine Derivatives 3. Sudafed Plus TABS 4. Sulfa Drugs  Family History Problems  1. Family history of Acute Myocardial Infarction : Mother 2. Family history of Family Health Status Number Of Children   2 daughters 3. Family history of hypertension (Z82.49)   Brothers 45. Family history of Lung Cancer : Father  Social History Problems  1. Denied: Alcohol Use 2. Caffeine Use   Less than 1 per day 3. Family history of Death In The Family Father   Age 17 4. History of Housing DME CPAP 5. Marital History - Currently Married 6. Never A Smoker 7. Occupation:   Disabled Marine scientist 8. Denied: Tobacco Use  Vitals Vital Signs [Data Includes: Last 1 Day]  Recorded: [id number] 01:56PM  Height: 5 ft 4 in Weight: 202 lb  BMI Calculated: 34.67 BSA Calculated: 1.96 Blood Pressure: 135 / 85, Sitting Temperature: 98 F, Oral Heart Rate: 76 Respiration: 16  Results/Data  Urine [Data Includes: Last 1 Day]   [id number]  COLOR YELLOW   APPEARANCE CLEAR   SPECIFIC GRAVITY 1.010   pH 7.0   GLUCOSE NEG mg/dL  BILIRUBIN NEG   KETONE NEG mg/dL  BLOOD NEG   PROTEIN NEG mg/dL  UROBILINOGEN 0.2 mg/dL  NITRITE POS   LEUKOCYTE ESTERASE SMALL   SQUAMOUS EPITHELIAL/HPF FEW   WBC 11-20 WBC/hpf  RBC 0-2 RBC/hpf  BACTERIA MANY   CRYSTALS NONE SEEN   CASTS NONE SEEN    Assessment Assessed  1. Urge and stress incontinence (N39.46) 2. Neurogenic bladder (N31.9)  Plan  Neurogenic bladder  1. Follow-up Schedule Surgery Office  Follow-up  Status: Complete  Done: [id number] Urinary tract infection  2. URINE CULTURE; Status:In Progress - Specimen/Data Collected;    Done: [id number]  Discussion/Summary   Diana Carroll's incontinence is much worse. She is leaking between catheterizations. She thinks the last Botox worked very well for at least 6 months.  There are no other modifying factors or associated signs or symptoms. There are no other aggravating or relieving factors. The presentation is moderate in severity and ongoing.  Clinically, she had not had any urinary tract infections.  Her urine is sent for culture today.   Diana Carroll had a Clostridium Difficile in 2013. She had recurrent streptococcal infection. She actually takes amoxicillin twice a day. She does not think antibiotics with my usual protocol is going to be a problem, but she will call her infectious disease doctor. I am going give her an antibiotic on call to the OR, but I will call if the culture is positive and start an antibiotic sooner.   After a thorough review of the management options for the patient's condition the patient  elected to proceed with surgical therapy as noted above. We have discussed the potential benefits and risks of the procedure, side effects of the proposed treatment, the likelihood of the patient achieving the goals of the procedure, and any potential problems that might occur during the procedure or recuperation. Informed consent has been obtained.

## 2015-06-05 ENCOUNTER — Encounter (HOSPITAL_BASED_OUTPATIENT_CLINIC_OR_DEPARTMENT_OTHER): Payer: Self-pay

## 2015-06-05 ENCOUNTER — Encounter (HOSPITAL_BASED_OUTPATIENT_CLINIC_OR_DEPARTMENT_OTHER)
Admission: RE | Disposition: A | Discharge status: Home / Self Care | Payer: Self-pay | Source: Ambulatory Visit | Attending: Urology

## 2015-06-05 ENCOUNTER — Ambulatory Visit (HOSPITAL_BASED_OUTPATIENT_CLINIC_OR_DEPARTMENT_OTHER)
Admission: RE | Admit: 2015-06-05 | Discharge: 2015-06-05 | Disposition: A | Discharge status: Home / Self Care | Payer: Medicare Other | Source: Ambulatory Visit | Attending: Urology | Admitting: Urology

## 2015-06-05 ENCOUNTER — Ambulatory Visit (HOSPITAL_BASED_OUTPATIENT_CLINIC_OR_DEPARTMENT_OTHER): Payer: Medicare Other | Admitting: Anesthesiology

## 2015-06-05 DIAGNOSIS — Z888 Allergy status to other drugs, medicaments and biological substances status: Secondary | ICD-10-CM | POA: Insufficient documentation

## 2015-06-05 DIAGNOSIS — N3941 Urge incontinence: Secondary | ICD-10-CM | POA: Insufficient documentation

## 2015-06-05 DIAGNOSIS — I890 Lymphedema, not elsewhere classified: Secondary | ICD-10-CM | POA: Insufficient documentation

## 2015-06-05 DIAGNOSIS — F419 Anxiety disorder, unspecified: Secondary | ICD-10-CM | POA: Diagnosis not present

## 2015-06-05 DIAGNOSIS — F329 Major depressive disorder, single episode, unspecified: Secondary | ICD-10-CM | POA: Diagnosis not present

## 2015-06-05 DIAGNOSIS — Z8541 Personal history of malignant neoplasm of cervix uteri: Secondary | ICD-10-CM | POA: Diagnosis not present

## 2015-06-05 DIAGNOSIS — I272 Other secondary pulmonary hypertension: Secondary | ICD-10-CM | POA: Insufficient documentation

## 2015-06-05 DIAGNOSIS — N318 Other neuromuscular dysfunction of bladder: Secondary | ICD-10-CM | POA: Insufficient documentation

## 2015-06-05 DIAGNOSIS — G4730 Sleep apnea, unspecified: Secondary | ICD-10-CM | POA: Insufficient documentation

## 2015-06-05 DIAGNOSIS — E780 Pure hypercholesterolemia: Secondary | ICD-10-CM | POA: Insufficient documentation

## 2015-06-05 DIAGNOSIS — M797 Fibromyalgia: Secondary | ICD-10-CM | POA: Diagnosis not present

## 2015-06-05 DIAGNOSIS — Z882 Allergy status to sulfonamides status: Secondary | ICD-10-CM | POA: Insufficient documentation

## 2015-06-05 DIAGNOSIS — N390 Urinary tract infection, site not specified: Secondary | ICD-10-CM | POA: Insufficient documentation

## 2015-06-05 DIAGNOSIS — K219 Gastro-esophageal reflux disease without esophagitis: Secondary | ICD-10-CM | POA: Diagnosis not present

## 2015-06-05 DIAGNOSIS — Z885 Allergy status to narcotic agent status: Secondary | ICD-10-CM | POA: Diagnosis not present

## 2015-06-05 HISTORY — PX: CYSTOSCOPY WITH INJECTION: SHX1424

## 2015-06-05 LAB — POCT HEMOGLOBIN-HEMACUE
Hemoglobin: 14.8 g/dL (ref 12.0–15.0)
Hemoglobin: 5.4 g/dL — CL (ref 12.0–15.0)

## 2015-06-05 SURGERY — CYSTOSCOPY, WITH INJECTION OF BLADDER NECK OR BLADDER WALL
Anesthesia: General | Site: Bladder

## 2015-06-05 MED ORDER — FENTANYL CITRATE (PF) 100 MCG/2ML IJ SOLN
INTRAMUSCULAR | Status: AC
  Filled 2015-06-05: qty 4

## 2015-06-05 MED ORDER — LACTATED RINGERS IV SOLN
INTRAVENOUS | Status: DC
  Administered 2015-06-05: 08:00:00 via INTRAVENOUS
  Filled 2015-06-05: qty 1000

## 2015-06-05 MED ORDER — STERILE WATER FOR IRRIGATION IR SOLN
Status: DC | PRN
  Administered 2015-06-05: 3000 mL

## 2015-06-05 MED ORDER — PROPOFOL 10 MG/ML IV BOLUS
INTRAVENOUS | Status: DC | PRN
Start: 2015-06-05 — End: 2015-06-05
  Administered 2015-06-05: 200 mg via INTRAVENOUS

## 2015-06-05 MED ORDER — OXYCODONE HCL 5 MG PO TABS
5.0000 mg | ORAL_TABLET | Freq: Once | ORAL | Status: DC | PRN
  Filled 2015-06-05: qty 1

## 2015-06-05 MED ORDER — LIDOCAINE HCL (CARDIAC) 20 MG/ML IV SOLN
INTRAVENOUS | Status: DC | PRN
  Administered 2015-06-05: 100 mg via INTRAVENOUS

## 2015-06-05 MED ORDER — ONDANSETRON HCL 4 MG/2ML IJ SOLN
INTRAMUSCULAR | Status: DC | PRN
  Administered 2015-06-05: 4 mg via INTRAVENOUS

## 2015-06-05 MED ORDER — FENTANYL CITRATE (PF) 100 MCG/2ML IJ SOLN
INTRAMUSCULAR | Status: DC | PRN
  Administered 2015-06-05: 25 ug via INTRAVENOUS

## 2015-06-05 MED ORDER — CIPROFLOXACIN IN D5W 400 MG/200ML IV SOLN
INTRAVENOUS | Status: AC
  Filled 2015-06-05: qty 200

## 2015-06-05 MED ORDER — SODIUM CHLORIDE 0.9 % IJ SOLN
INTRAMUSCULAR | Status: DC | PRN
  Administered 2015-06-05: 20 mL

## 2015-06-05 MED ORDER — MIDAZOLAM HCL 2 MG/2ML IJ SOLN
INTRAMUSCULAR | Status: AC
  Filled 2015-06-05: qty 2

## 2015-06-05 MED ORDER — ONABOTULINUMTOXINA 100 UNITS IJ SOLR
INTRAMUSCULAR | Status: DC | PRN
  Administered 2015-06-05: 200 [IU] via INTRAMUSCULAR

## 2015-06-05 MED ORDER — SCOPOLAMINE 1 MG/3DAYS TD PT72
1.0000 | MEDICATED_PATCH | TRANSDERMAL | Status: DC
  Administered 2015-06-05: 1.5 mg via TRANSDERMAL
  Filled 2015-06-05: qty 1

## 2015-06-05 MED ORDER — DEXAMETHASONE SODIUM PHOSPHATE 4 MG/ML IJ SOLN
INTRAMUSCULAR | Status: DC | PRN
  Administered 2015-06-05: 10 mg via INTRAVENOUS

## 2015-06-05 MED ORDER — CIPROFLOXACIN IN D5W 400 MG/200ML IV SOLN
400.0000 mg | INTRAVENOUS | Status: AC
  Administered 2015-06-05: 400 mg via INTRAVENOUS
  Filled 2015-06-05: qty 200

## 2015-06-05 MED ORDER — MIDAZOLAM HCL 5 MG/5ML IJ SOLN
INTRAMUSCULAR | Status: DC | PRN
  Administered 2015-06-05: 2 mg via INTRAVENOUS

## 2015-06-05 MED ORDER — SCOPOLAMINE 1 MG/3DAYS TD PT72
MEDICATED_PATCH | TRANSDERMAL | Status: AC
  Filled 2015-06-05: qty 1

## 2015-06-05 MED ORDER — FENTANYL CITRATE (PF) 100 MCG/2ML IJ SOLN
25.0000 ug | INTRAMUSCULAR | Status: DC | PRN
  Filled 2015-06-05: qty 1

## 2015-06-05 MED ORDER — ACETAMINOPHEN 10 MG/ML IV SOLN
INTRAVENOUS | Status: DC | PRN
  Administered 2015-06-05: 1000 mg via INTRAVENOUS

## 2015-06-05 MED ORDER — OXYCODONE HCL 5 MG/5ML PO SOLN
5.0000 mg | Freq: Once | ORAL | Status: DC | PRN
  Filled 2015-06-05: qty 5

## 2015-06-05 MED ORDER — ONDANSETRON HCL 4 MG/2ML IJ SOLN
4.0000 mg | Freq: Four times a day (QID) | INTRAMUSCULAR | Status: DC | PRN
  Filled 2015-06-05: qty 2

## 2015-06-05 SURGICAL SUPPLY — 21 items
BAG DRAIN URO-CYSTO SKYTR STRL (DRAIN) ×2 IMPLANT
CANISTER SUCT LVC 12 LTR MEDI- (MISCELLANEOUS) ×2 IMPLANT
CATH ROBINSON RED A/P 12FR (CATHETERS) IMPLANT
CATH ROBINSON RED A/P 14FR (CATHETERS) ×2 IMPLANT
CLOTH BEACON ORANGE TIMEOUT ST (SAFETY) ×2 IMPLANT
ELECT REM PT RETURN 9FT ADLT (ELECTROSURGICAL)
ELECTRODE REM PT RTRN 9FT ADLT (ELECTROSURGICAL) IMPLANT
GLOVE BIO SURGEON STRL SZ7 (GLOVE) ×2 IMPLANT
GLOVE BIO SURGEON STRL SZ7.5 (GLOVE) ×2 IMPLANT
GLOVE INDICATOR 7.0 STRL GRN (GLOVE) ×2 IMPLANT
GOWN STRL REUS W/ TWL LRG LVL3 (GOWN DISPOSABLE) ×2 IMPLANT
GOWN STRL REUS W/ TWL XL LVL3 (GOWN DISPOSABLE) ×1 IMPLANT
GOWN STRL REUS W/TWL LRG LVL3 (GOWN DISPOSABLE) ×2
GOWN STRL REUS W/TWL XL LVL3 (GOWN DISPOSABLE) ×3 IMPLANT
MANIFOLD NEPTUNE II (INSTRUMENTS) IMPLANT
NDL SAFETY ECLIPSE 18X1.5 (NEEDLE) ×1 IMPLANT
NEEDLE HYPO 18GX1.5 SHARP (NEEDLE) ×1
PACK CYSTO (CUSTOM PROCEDURE TRAY) ×2 IMPLANT
SYR 20CC LL (SYRINGE) ×2 IMPLANT
SYR BULB IRRIGATION 50ML (SYRINGE) IMPLANT
WATER STERILE IRR 3000ML UROMA (IV SOLUTION) ×2 IMPLANT

## 2015-06-05 NOTE — Anesthesia Preprocedure Evaluation (Signed)
Anesthesia Evaluation  Patient identified by MRN, date of birth, ID band Patient awake    Reviewed: Allergy & Precautions, NPO status , Patient's Chart, lab work & pertinent test results  History of Anesthesia Complications (+) PONV  Airway Mallampati: II   Neck ROM: full    Dental   Pulmonary sleep apnea ,  breath sounds clear to auscultation        Cardiovascular + Peripheral Vascular Disease Rhythm:regular Rate:Normal     Neuro/Psych PSYCHIATRIC DISORDERS Anxiety Depression  Neuromuscular disease    GI/Hepatic GERD-  ,  Endo/Other  obese  Renal/GU      Musculoskeletal  (+) Arthritis -, Fibromyalgia -  Abdominal   Peds  Hematology   Anesthesia Other Findings   Reproductive/Obstetrics                             Anesthesia Physical Anesthesia Plan  ASA: II  Anesthesia Plan: General   Post-op Pain Management:    Induction: Intravenous  Airway Management Planned: LMA  Additional Equipment:   Intra-op Plan:   Post-operative Plan:   Informed Consent: I have reviewed the patients History and Physical, chart, labs and discussed the procedure including the risks, benefits and alternatives for the proposed anesthesia with the patient or authorized representative who has indicated his/her understanding and acceptance.     Plan Discussed with: CRNA, Anesthesiologist and Surgeon  Anesthesia Plan Comments:         Anesthesia Quick Evaluation

## 2015-06-05 NOTE — Transfer of Care (Signed)
Immediate Anesthesia Transfer of Care Note  Patient: Diana Carroll  Procedure(s) Performed: [name]  Patient Location: PACU  Anesthesia Type:General  Level of Consciousness: sedated  Airway & Oxygen Therapy: Patient Spontanous Breathing and Patient connected to nasal cannula oxygen  Post-op Assessment: Report given to RN  Post vital signs: Reviewed and stable  Last Vitals:  Filed Vitals:   06/05/15 0656  BP: 117/67  Pulse: 70  Temp: 36.8 C  Resp: 16    Complications: No apparent anesthesia complications

## 2015-06-05 NOTE — Interval H&P Note (Signed)
History and Physical Interval Note:  06/05/2015 7:08 AM  Diana Carroll  has presented today for surgery, with the diagnosis of [facility] BLADDER  The various methods of treatment have been discussed with the patient and family. After consideration of risks, benefits and other options for treatment, the patient has consented to  [name] as a surgical intervention .  The patient's history has been reviewed, patient examined, no change in status, stable for surgery.  I have reviewed the patient's chart and labs.  Questions were answered to the patient's satisfaction.     Taraoluwa Thakur A

## 2015-06-05 NOTE — Interval H&P Note (Signed)
History and Physical Interval Note:  06/05/2015 7:08 AM  Diana Carroll  has presented today for surgery, with the diagnosis of [name]  The various methods of treatment have been discussed with the patient and family. After consideration of risks, benefits and other options for treatment, the patient has consented to  [name] as a surgical intervention .  The patient's history has been reviewed, patient examined, no change in status, stable for surgery.  I have reviewed the patient's chart and labs.  Questions were answered to the patient's satisfaction.     [name]

## 2015-06-05 NOTE — Anesthesia Postprocedure Evaluation (Signed)
Anesthesia Post Note  Patient: Diana Carroll  Procedure(s) Performed: Procedure(s) (LRB): CYSTOSCOPY WITH BOTOX INJECTION (N/A)  Anesthesia type: General  Patient location: PACU  Post pain: Pain level controlled and Adequate analgesia  Post assessment: Post-op Vital signs reviewed, Patient's Cardiovascular Status Stable, Respiratory Function Stable, Patent Airway and Pain level controlled  Last Vitals:  Filed Vitals:   06/05/15 1030  BP: 99/51  Pulse: 70  Temp:   Resp: 17    Post vital signs: Reviewed and stable  Level of consciousness: awake, alert  and oriented  Complications: No apparent anesthesia complications

## 2015-06-05 NOTE — Discharge Instructions (Signed)
I have reviewed discharge instructions in detail with the patient. They will follow-up with me or their physician as scheduled. My nurse will also be calling the patients as per protocol.     CYSTOSCOPY HOME CARE INSTRUCTIONS  Activity: Rest for the remainder of the day.  Do not drive or operate equipment today.  You may resume normal activities in one to two days as instructed by your physician.   Meals: Drink plenty of liquids and eat light foods such as gelatin or soup this evening.  You may return to a normal meal plan tomorrow.  Return to Work: You may return to work in one to two days or as instructed by your physician.  Special Instructions / Symptoms: Call your physician if any of these symptoms occur:   -persistent or heavy bleeding  -bleeding which continues after first few urination  -large blood clots that are difficult to pass  -urine stream diminishes or stops completely  -fever equal to or higher than 101 degrees Farenheit.  -cloudy urine with a strong, foul odor  -severe pain  Females should always wipe from front to back after elimination.  You may feel some burning pain when you urinate.  This should disappear with time.  Applying moist heat to the lower abdomen or a hot tub bath may help relieve the pain. \  Follow-Up / Date of Return Visit to Your Physician:  Call for an appointment to arrange follow-up.     Post Anesthesia Home Care Instructions  Activity: Get plenty of rest for the remainder of the day. A responsible adult should stay with you for 24 hours following the procedure.  For the next 24 hours, DO NOT: -Drive a car -Paediatric nurse -Drink alcoholic beverages -Take any medication unless instructed by your physician -Make any legal decisions or sign important papers.  Meals: Start with liquid foods such as gelatin or soup. Progress to regular foods as tolerated. Avoid greasy, spicy, heavy foods. If nausea and/or vomiting occur, drink only  clear liquids until the nausea and/or vomiting subsides. Call your physician if vomiting continues.  Special Instructions/Symptoms: Your throat may feel dry or sore from the anesthesia or the breathing tube placed in your throat during surgery. If this causes discomfort, gargle with warm salt water. The discomfort should disappear within 24 hours.  If you had a scopolamine patch placed behind your ear for the management of post- operative nausea and/or vomiting:  1. The medication in the patch is effective for 72 hours, after which it should be removed.  Wrap patch in a tissue and discard in the trash. Wash hands thoroughly with soap and water. 2. You may remove the patch earlier than 72 hours if you experience unpleasant side effects which may include dry mouth, dizziness or visual disturbances. 3. Avoid touching the patch. Wash your hands with soap and water after contact with the patch.

## 2015-06-05 NOTE — Op Note (Signed)
Preoperative diagnosis: Neurogenic detrusor overactivity and urgency incontinence Postoperative diagnosis: Neurogenic detrusor overactivity and urgency incontinence Surgery: Cystoscopy and injection of botulinum toxin Surgeon: Dr. Nicki Reaper Riyan Gavina Assistant: Dr. Star Age  The patient has the above diagnoses and consented above procedure. The ACMI scope was utilized. She grade 2-4 bladder trabeculation. I could see a bleb of collagen in the suburethral. Urethra was healthy. There is no cystitis. I saw the trigone easily  200 units of Botox was mixed in 20 mL and normal saline. I injected at 5 and 7:00 and cephalad to the intraureteric ridge in the lower third of the bladder using my usual template. There was no bleeding. Bladder was emptied. Patient taken to recovery

## 2015-06-05 NOTE — Anesthesia Procedure Notes (Signed)
Procedure Name: LMA Insertion Date/Time: 06/05/2015 9:32 AM Performed by: Bethena Roys T Pre-anesthesia Checklist: Patient identified, Emergency Drugs available, Suction available and Patient being monitored Patient Re-evaluated:Patient Re-evaluated prior to inductionOxygen Delivery Method: Circle System Utilized Preoxygenation: Pre-oxygenation with 100% oxygen Intubation Type: IV induction Ventilation: Mask ventilation without difficulty LMA: LMA inserted LMA Size: 5.0 Number of attempts: 1 Airway Equipment and Method: Bite block Placement Confirmation: positive ETCO2 Tube secured with: Tape Dental Injury: Teeth and Oropharynx as per pre-operative assessment

## 2015-06-06 ENCOUNTER — Encounter (HOSPITAL_BASED_OUTPATIENT_CLINIC_OR_DEPARTMENT_OTHER): Payer: Self-pay | Admitting: Urology

## 2017-08-20 ENCOUNTER — Other Ambulatory Visit (HOSPITAL_COMMUNITY)
Admission: RE | Admit: 2017-08-20 | Discharge: 2017-08-20 | Disposition: A | Discharge status: Home / Self Care | Payer: Medicare Other | Source: Ambulatory Visit | Attending: Urology | Admitting: Urology

## 2017-08-20 DIAGNOSIS — R69 Illness, unspecified: Secondary | ICD-10-CM | POA: Diagnosis present

## 2017-08-22 LAB — URINE CULTURE: Culture: <10000 — AB

## 2020-04-20 ENCOUNTER — Ambulatory Visit: Payer: Medicare Other

## 2021-10-21 ENCOUNTER — Other Ambulatory Visit: Payer: Self-pay

## 2021-10-21 ENCOUNTER — Ambulatory Visit: Payer: Medicare Other | Attending: Adult Reconstructive Orthopaedic Surgery | Admitting: Physical Therapy

## 2021-10-21 ENCOUNTER — Encounter: Payer: Self-pay | Admitting: Physical Therapy

## 2021-10-21 DIAGNOSIS — M25551 Pain in right hip: Secondary | ICD-10-CM | POA: Diagnosis present

## 2021-10-21 DIAGNOSIS — M25651 Stiffness of right hip, not elsewhere classified: Secondary | ICD-10-CM | POA: Diagnosis present

## 2021-10-21 DIAGNOSIS — M6281 Muscle weakness (generalized): Secondary | ICD-10-CM | POA: Diagnosis present

## 2021-10-21 NOTE — Therapy (Signed)
[facility] [place], [place], [address] Phone: [phone]   Fax:  [phone]  Physical Therapy Evaluation  Patient Details  Name: Diana Carroll MRN: [id number] Date of Birth: [birth year] Referring Provider (PT): Tyson Alias MD   Encounter Date: 10/21/2021   PT End of Session - 10/21/21 1538     Visit Number 1    Number of Visits 1    Date for PT Re-Evaluation 10/21/21    PT Start Time 0146    PT Stop Time 0215    PT Time Calculation (min) 29 min    Activity Tolerance Patient tolerated treatment well    Behavior During Therapy [facility] for tasks assessed/performed             Past Medical History:  Diagnosis Date   Anxiety    Arthritis    Cancer of left breast ([place]) S/P BILATERAL MASTECTOMY AND LEFT NODE DISSECTION NOV 2013--  CHEMO COMPLETED 02-25-2013---  NO RADIATION   ONCOLOGIST  AT [facility]--  DCIS, Stage I, Grade III pT1c N0 M0---   no recurrence   Chronic venous insufficiency    Depression    Fibromyalgia    GERD (gastroesophageal reflux disease)    MEDICAL MANAGEMENT   History of cancer chemotherapy    completed 02/2013   History of cellulitis and abscess ADMITTED AT [facility] APRIL 2014-- PT STATES  RESOLVED   History of Clostridium difficile infection CHRONIC --     History of Pseudomonas pneumonia    Lymphedema of both lower extremities    Neurogenic bladder    OSA on CPAP    BI PAP   PONV (postoperative nausea and vomiting)    Pulmonary hypertension, primary ([place]) DX 2001---  STAGE NYHA FUNCTIONAL CLASS II   MONTIOR BY DR Kathi Ludwig -- PULMOLOGIST AT [facility] --- LOV NOTE AND CXR REQUESTED #[id number]  (03-31-2013 AND CXR 03-04-2013 W/ CHART)   Self-catheterizes urinary bladder    Thyroid goiter MONITORED AT [facility]    Past Surgical History:  Procedure Laterality Date   ABDOMINAL HYSTERECTOMY  1991   hx cervical cancer   [place]   BILATERAL  MASTECTOMY AND LEFT NODE DISSECTION  NOV 2013   LEFT BREAST CANCER   BILATERAL SALPINGOOPHORECTOMY  1996   &  2003   BLADDER SUSPENSION  07-01-2006   [place] SLING   BREAST RECONSTRUCTION REVISION  Left 07-31-2014   CARDIAC CATHETERIZATION  2009---  [facility]   CARDIOVASCULAR STRESS TEST  2001   CARPAL TUNNEL RELEASE Bilateral 2007   CYSTOSCOPY W/ URETHROPEXY AND COLLAGEN INJECTION  X2  2007   CYSTOSCOPY WITH INJECTION  04/08/2012   Procedure: CYSTOSCOPY WITH INJECTION;  Surgeon: Reece Packer, MD;  Location: [place];  Service: Urology;  Laterality: N/A;  with botox   CYSTOSCOPY WITH INJECTION N/A 04/18/2013   Procedure: CYSTOSCOPY WITH BOTOX INJECTION;  Surgeon: Reece Packer, MD;  Location: [place];  Service: Urology;  Laterality: N/A;   CYSTOSCOPY WITH INJECTION N/A 08/15/2014   Procedure: CYSTOSCOPY WITH BOTOX INJECTION;  Surgeon: Reece Packer, MD;  Location: [place];  Service: Urology;  Laterality: N/A;   CYSTOSCOPY WITH INJECTION N/A 06/05/2015   Procedure: CYSTOSCOPY WITH BOTOX INJECTION;  Surgeon: Bjorn Loser, MD;  Location: [facility];  Service: Urology;  Laterality: N/A;   EXPLANTATION INTERSTIM IMPLANT/ CYSTO/ MACROPLASTIQUE INJECTION  08-05-2010   HERNIA REPAIR  1993  rt  inguinal   INTERSTIM IMPLANT PLACEMENT  2004   PLACEMENT PORT-A-CATH  12-09-2012   REMOVAL OF URINARY SLING  11-10-2006   RIGHT BREAST TISSUE EXPANDER REPLACED  04-04-2013   TRANSTHORACIC ECHOCARDIOGRAM  last one 10-22-2014  [facility]    NORMAL SIZE AND FUNCTION LV & RV/ LVSF NORMAL/ TRACE TR/ EF 55-60%/ NO PULMONARY HYPERTENSION (last echo showed mild pulmonary HTN)    There were no vitals filed for this visit.    Subjective Assessment - 10/21/21 1513     Subjective COVID-19 screen performed prior to patient entering clinic.  The patient presents to the clinic with c/o right hip pain.  She is scheduled for hip surgery  next week and will like follow-up with formal PT after that.  She states she is to have a posterior hip replacement.  We went over hip precautions for this surgery.  She wears compression stocking for lymphedema and we discussed obtaining a grabber to assist donning post surgical (an in-hospital PT/OT evaluation would certainly benefit her.    Pertinent History Bilateral mastectomy, chronic venous insufficiency, Fibromylagia, bilateral CTR.    Diagnostic tests X-ray.    Patient Stated Goals Have good surgical outcome and get out of pain.                [facility] PT Assessment - 10/21/21 0001       Assessment   Medical Diagnosis Primary OA of right hip.    Referring Provider (PT) Tyson Alias MD    Onset Date/Surgical Date --   Ongoing.     Precautions   Precaution Comments Hip precautions following surgery and no [place].      Balance Screen   Has the patient fallen in the past 6 months No      [place] residence      Prior Function   Level of Independence Independent      ROM / Strength   AROM / PROM / Strength AROM;Strength      AROM   Overall AROM Comments The patient's right hip is limited to 90 degrees (left is 115 degrees) and right hip IR is limited to 10 degrees (right I/ER is normal).      Strength   Overall Strength Comments Normal right hip strength.      Palpation   Palpation comment Patient complaints in right hip.      Bed Mobility   Bed Mobility Supine to Sit    Supine to Sit Independent      Ambulation/Gait   Gait Comments The patient's gait is antalgic and she walks in some trunk flexion.                        Objective measurements completed on examination: See above findings.                PT Education - 10/21/21 1528     Education Details Hip precautions.  Patient states she will be undergoing a posterior total hip replacement on the right.    Person(s) Educated Patient     Methods Explanation;Demonstration                 PT Long Term Goals - 10/21/21 1537       PT LONG TERM GOAL #1   Title Independent with a HEP.      PT LONG TERM GOAL #2   Title Update goals following a post-op  assessment.    Period Weeks    Status New                    Plan - 10/21/21 1533     Clinical Impression Statement The patient presents to OPPT for a pre-operative evaluation as she is to have a right total hip replacement on 10/29/21.  She states she is undergoing a posterior approach.  We went over hip precautions.  Her right hip flexion is limited to 90 degrees and IR to 10 degrees.  Her hip has normal strength.  Her gait is antalgic in nature.  Plan would be to see her post-operatively.    Personal Factors and Comorbidities Other;Comorbidity 1    Examination-Activity Limitations Other;Locomotion Level    Examination-Participation Restrictions Other    Stability/Clinical Decision Making Evolving/Moderate complexity    Clinical Decision Making Low    PT Next Visit Plan Post-op total hip rehab mindful of hip precautions.    Consulted and Agree with Plan of Care Patient             Patient will benefit from skilled therapeutic intervention in order to improve the following deficits and impairments:  Pain, Abnormal gait, Difficulty walking, Decreased activity tolerance, Decreased range of motion  Visit Diagnosis: Pain in right hip - Plan: PT plan of care cert/re-cert  Stiffness of right hip, not elsewhere classified - Plan: PT plan of care cert/re-cert     Problem List There are no problems to display for this patient.   Hashim Eichhorst, [place], PT 10/21/2021, 3:40 PM  [facility] [address] [place], [place], [address] Phone: [phone]   Fax:  [phone]  Name: Diana Carroll MRN: [id number] Date of Birth: 12-18-1960

## 2021-11-05 ENCOUNTER — Other Ambulatory Visit: Payer: Self-pay

## 2021-11-05 ENCOUNTER — Ambulatory Visit: Payer: Medicare Other | Admitting: Physical Therapy

## 2021-11-05 DIAGNOSIS — M25551 Pain in right hip: Secondary | ICD-10-CM

## 2021-11-05 DIAGNOSIS — M6281 Muscle weakness (generalized): Secondary | ICD-10-CM

## 2021-11-05 NOTE — Therapy (Signed)
[facility] [place], [place], [address] Phone: [phone]   Fax:  [phone]  Physical Therapy Treatment  Patient Details  Name: Diana Carroll MRN: [id number] Date of Birth: March 07, 1961 Referring Provider (PT): Tyson Alias MD   Encounter Date: 11/05/2021   PT End of Session - 11/05/21 1316     Visit Number 1    Number of Visits 12    Date for PT Re-Evaluation 12/02/21    PT Start Time 0100    PT Stop Time 0142    PT Time Calculation (min) 42 min    Activity Tolerance Patient tolerated treatment well    Behavior During Therapy [facility] for tasks assessed/performed             Past Medical History:  Diagnosis Date   Anxiety    Arthritis    Cancer of left breast ([place]) S/P BILATERAL MASTECTOMY AND LEFT NODE DISSECTION NOV 2013--  CHEMO COMPLETED 02-25-2013---  NO RADIATION   ONCOLOGIST  AT [facility]--  DCIS, Stage I, Grade III pT1c N0 M0---   no recurrence   Chronic venous insufficiency    Depression    Fibromyalgia    GERD (gastroesophageal reflux disease)    MEDICAL MANAGEMENT   History of cancer chemotherapy    completed 02/2013   History of cellulitis and abscess ADMITTED AT [facility] APRIL 2014-- PT STATES  RESOLVED   History of Clostridium difficile infection CHRONIC --     History of Pseudomonas pneumonia    Lymphedema of both lower extremities    Neurogenic bladder    OSA on CPAP    BI PAP   PONV (postoperative nausea and vomiting)    Pulmonary hypertension, primary ([place]) DX 2001---  STAGE NYHA FUNCTIONAL CLASS II   MONTIOR BY DR Kathi Ludwig -- PULMOLOGIST AT [facility] --- LOV NOTE AND CXR REQUESTED #[id number]  (03-31-2013 AND CXR 03-04-2013 W/ CHART)   Self-catheterizes urinary bladder    Thyroid goiter MONITORED AT [facility]    Past Surgical History:  Procedure Laterality Date   ABDOMINAL HYSTERECTOMY  1991   hx cervical cancer   [place]   BILATERAL  MASTECTOMY AND LEFT NODE DISSECTION  NOV 2013   LEFT BREAST CANCER   BILATERAL SALPINGOOPHORECTOMY  1996   &  2003   BLADDER SUSPENSION  07-01-2006   [place] SLING   BREAST RECONSTRUCTION REVISION  Left 07-31-2014   CARDIAC CATHETERIZATION  2009---  [facility]   CARDIOVASCULAR STRESS TEST  2001   CARPAL TUNNEL RELEASE Bilateral 2007   CYSTOSCOPY W/ URETHROPEXY AND COLLAGEN INJECTION  X2  2007   CYSTOSCOPY WITH INJECTION  04/08/2012   Procedure: CYSTOSCOPY WITH INJECTION;  Surgeon: Reece Packer, MD;  Location: [place];  Service: Urology;  Laterality: N/A;  with botox   CYSTOSCOPY WITH INJECTION N/A 04/18/2013   Procedure: CYSTOSCOPY WITH BOTOX INJECTION;  Surgeon: Reece Packer, MD;  Location: [place];  Service: Urology;  Laterality: N/A;   CYSTOSCOPY WITH INJECTION N/A 08/15/2014   Procedure: CYSTOSCOPY WITH BOTOX INJECTION;  Surgeon: Reece Packer, MD;  Location: [place];  Service: Urology;  Laterality: N/A;   CYSTOSCOPY WITH INJECTION N/A 06/05/2015   Procedure: CYSTOSCOPY WITH BOTOX INJECTION;  Surgeon: Bjorn Loser, MD;  Location: [facility];  Service: Urology;  Laterality: N/A;   EXPLANTATION INTERSTIM IMPLANT/ CYSTO/ MACROPLASTIQUE INJECTION  08-05-2010   HERNIA REPAIR  1993  rt  inguinal   INTERSTIM IMPLANT PLACEMENT  2004   PLACEMENT PORT-A-CATH  12-09-2012   REMOVAL OF URINARY SLING  11-10-2006   RIGHT BREAST TISSUE EXPANDER REPLACED  04-04-2013   TRANSTHORACIC ECHOCARDIOGRAM  last one 10-22-2014  [facility]    NORMAL SIZE AND FUNCTION LV & RV/ LVSF NORMAL/ TRACE TR/ EF 55-60%/ NO PULMONARY HYPERTENSION (last echo showed mild pulmonary HTN)    There were no vitals filed for this visit.   Subjective Assessment - 11/05/21 1315     Subjective COVID-19 screen performed prior to patient entering clinic.  See "clinical impression statement."    Pertinent History Bilateral mastectomy, chronic venous  insufficiency, Fibromylagia, bilateral CTR.    Diagnostic tests X-ray.    Patient Stated Goals Have good surgical outcome and get out of pain.    Currently in Pain? Yes    Pain Score 2     Pain Location Hip    Pain Orientation Right    Pain Descriptors / Indicators Aching    Pain Type Surgical pain    Pain Onset In the past 7 days                               [facility] Adult PT Treatment/Exercise - 11/05/21 0001       Exercises   Exercises Knee/Hip      Knee/Hip Exercises: Aerobic   Nustep Level 2 to 80 degrees of right hip flexion x 8 minutes.      Modalities   Modalities Cryotherapy      Cryotherapy   Number Minutes Cryotherapy 10 Minutes    Cryotherapy Location --   Right hip.   Type of Cryotherapy Ice pack                          PT Long Term Goals - 11/05/21 1329       PT LONG TERM GOAL #1   Title Independent with a HEP.    Time 6    Period Weeks    Status New      PT LONG TERM GOAL #2   Title Perform a reciprocating stair gait with one railing.    Time 6    Period Weeks    Status New      PT LONG TERM GOAL #3   Title Right hip strength to 5/5 to increase stability for functional tasks.    Time 6    Period Weeks    Status New      PT LONG TERM GOAL #4   Title Walk 250 feet in clinic without assisitve device.    Time 6    Period Weeks    Status New                   Plan - 11/05/21 1317     Clinical Impression Statement The patient returns today s/p right posterior total hip replacement performed on 10/29/21.  Her pain-level is a 2/10. She is complinat to using her TED hose and is doing her HEP.  She states she is yet to do a "snow angel" but is trying.  He Aquacel is intact over her right hip.  She has been experiencing some groin pain.  She transfers from sit to supine using her left LE to assist her right.  She can performed an anti-gravity SAQ.  She cannot perform a SLR currently.  She  is walking safely  with a FWW with a step-to gait pattern.    Personal Factors and Comorbidities Other;Comorbidity 1    Examination-Activity Limitations Other;Locomotion Level    Examination-Participation Restrictions Other    Stability/Clinical Decision Making Evolving/Moderate complexity    Rehab Potential Excellent    PT Frequency 2x / week    PT Duration 6 weeks    PT Treatment/Interventions ADLs/Self Care Home Management;Cryotherapy;Electrical Stimulation;Moist Heat;Gait training;Stair training;Functional mobility training;Therapeutic activities;Therapeutic exercise;Neuromuscular re-education;Manual techniques;Balance training    PT Next Visit Plan TKA protocol with posterior hip precautions.  Nustep, stair training with railing, right LE strengthening, GT, progress to cane.             Patient will benefit from skilled therapeutic intervention in order to improve the following deficits and impairments:  Pain, Abnormal gait, Difficulty walking, Decreased activity tolerance, Decreased range of motion  Visit Diagnosis: Pain in right hip - Plan: PT plan of care cert/re-cert, CANCELED: PT plan of care cert/re-cert  Muscle weakness (generalized) - Plan: PT plan of care cert/re-cert, CANCELED: PT plan of care cert/re-cert     Problem List There are no problems to display for this patient.   Ruchama Kubicek, [place], PT 11/05/2021, 1:45 PM  [facility] [address] [place], [place], [address] Phone: [phone]   Fax:  [phone]  Name: Diana Carroll MRN: [id number] Date of Birth: 08-Mar-1961

## 2021-11-13 ENCOUNTER — Other Ambulatory Visit: Payer: Self-pay

## 2021-11-13 ENCOUNTER — Ambulatory Visit: Payer: Medicare Other | Attending: Adult Reconstructive Orthopaedic Surgery | Admitting: Physical Therapy

## 2021-11-13 DIAGNOSIS — M25651 Stiffness of right hip, not elsewhere classified: Secondary | ICD-10-CM | POA: Insufficient documentation

## 2021-11-13 DIAGNOSIS — M6281 Muscle weakness (generalized): Secondary | ICD-10-CM | POA: Diagnosis present

## 2021-11-13 DIAGNOSIS — M25551 Pain in right hip: Secondary | ICD-10-CM | POA: Insufficient documentation

## 2021-11-13 NOTE — Therapy (Signed)
[facility] [place], [place], [address] Phone: [phone]   Fax:  [phone]  Physical Therapy Treatment  Patient Details  Name: Diana Carroll MRN: [id number] Date of Birth: Jun 29, 1961 Referring Provider (PT): Tyson Alias MD   Encounter Date: 11/13/2021   PT End of Session - 11/13/21 1454     Visit Number 3    Number of Visits 12    Date for PT Re-Evaluation 12/02/21    PT Start Time 0145    PT Stop Time 0226    PT Time Calculation (min) 41 min             Past Medical History:  Diagnosis Date   Anxiety    Arthritis    Cancer of left breast ([place]) S/P BILATERAL MASTECTOMY AND LEFT NODE DISSECTION NOV 2013--  CHEMO COMPLETED 02-25-2013---  NO RADIATION   ONCOLOGIST  AT [facility]--  DCIS, Stage I, Grade III pT1c N0 M0---   no recurrence   Chronic venous insufficiency    Depression    Fibromyalgia    GERD (gastroesophageal reflux disease)    MEDICAL MANAGEMENT   History of cancer chemotherapy    completed 02/2013   History of cellulitis and abscess ADMITTED AT [facility] APR[facility] 2014-- PT STATES  RESOLVED   History of Clostridium difficile infection CHRONIC --     History of Pseudomonas pneumonia    Lymphedema of both lower extremities    Neurogenic bladder    OSA on CPAP    BI PAP   PONV (postoperative nausea and vomiting)    Pulmonary hypertension, primary ([place]) DX 2001---  STAGE NYHA FUNCTIONAL CLASS II   MONTIOR BY DR Kathi Ludwig -- PULMOLOGIST AT [facility] --- LOV NOTE AND CXR REQUESTED #[id number]  (03-31-2013 AND CXR 03-04-2013 W/ CHART)   Self-catheterizes urinary bladder    Thyroid goiter MONITORED AT [facility]    Past Surgical History:  Procedure Laterality Date   ABDOMINAL HYSTERECTOMY  1991   hx cervical cancer   [place]   BILATERAL MASTECTOMY AND LEFT NODE DISSECTION  NOV 2013   LEFT BREAST CANCER   BILATERAL SALPINGOOPHORECTOMY  1996   &  2003    BLADDER SUSPENSION  07-01-2006   [place] SLING   BREAST RECONSTRUCTION REVISION  Left 07-31-2014   CARDIAC CATHETERIZATION  2009---  [facility]   CARDIOVASCULAR STRESS TEST  2001   CARPAL TUNNEL RELEASE Bilateral 2007   CYSTOSCOPY W/ URETHROPEXY AND COLLAGEN INJECTION  X2  2007   CYSTOSCOPY WITH INJECTION  04/08/2012   Procedure: CYSTOSCOPY WITH INJECTION;  Surgeon: Reece Packer, MD;  Location: [place];  Service: Urology;  Laterality: N/A;  with botox   CYSTOSCOPY WITH INJECTION N/A 04/18/2013   Procedure: CYSTOSCOPY WITH BOTOX INJECTION;  Surgeon: Reece Packer, MD;  Location: [place];  Service: Urology;  Laterality: N/A;   CYSTOSCOPY WITH INJECTION N/A 08/15/2014   Procedure: CYSTOSCOPY WITH BOTOX INJECTION;  Surgeon: Reece Packer, MD;  Location: [place];  Service: Urology;  Laterality: N/A;   CYSTOSCOPY WITH INJECTION N/A 06/05/2015   Procedure: CYSTOSCOPY WITH BOTOX INJECTION;  Surgeon: Bjorn Loser, MD;  Location: [facility];  Service: Urology;  Laterality: N/A;   EXPLANTATION INTERSTIM IMPLANT/ CYSTO/ MACROPLASTIQUE INJECTION  08-05-2010   HERNIA REPAIR  1993   rt  inguinal   INTERSTIM IMPLANT PLACEMENT  2004   PLACEMENT PORT-A-CATH  12-09-2012  REMOVAL OF URINARY SLING  11-10-2006   RIGHT BREAST TISSUE EXPANDER REPLACED  04-04-2013   TRANSTHORACIC ECHOCARDIOGRAM  last one 10-22-2014  [facility]    NORMAL SIZE AND FUNCTION LV & RV/ LVSF NORMAL/ TRACE TR/ EF 55-60%/ NO PULMONARY HYPERTENSION (last echo showed mild pulmonary HTN)    There were no vitals filed for this visit.   Subjective Assessment - 11/13/21 1447     Subjective COVID-19 screen performed prior to patient entering clinic.  Patient took Aquacel off Saturday and return to MD yesterday.  She is doing well with a 2/10 pain-level today and some continued c/o right groin pain.  She has tried some walking with a straight cane at home.     Pertinent History Bilateral mastectomy, chronic venous insufficiency, Fibromylagia, bilateral CTR.    Diagnostic tests X-ray.    Currently in Pain? Yes    Pain Score 2     Pain Orientation Right    Pain Descriptors / Indicators Aching    Pain Onset 1 to 4 weeks ago                               [facility] Adult PT Treatment/Exercise - 11/13/21 0001       Exercises   Exercises Knee/Hip      Knee/Hip Exercises: Aerobic   Nustep Level 2 to 80 degrees of right knee flexion x 13 minutes.      Knee/Hip Exercises: Supine   Short Arc Quad Sets Limitations SAQ's x 16 minutes facilitated with Bi-Phasic electrical stimulation with 10 sec extension holds and 10 sec rest).                          PT Long Term Goals - 11/05/21 1329       PT LONG TERM GOAL #1   Title Independent with a HEP.    Time 6    Period Weeks    Status New      PT LONG TERM GOAL #2   Title Perform a reciprocating stair gait with one railing.    Time 6    Period Weeks    Status New      PT LONG TERM GOAL #3   Title Right hip strength to 5/5 to increase stability for functional tasks.    Time 6    Period Weeks    Status New      PT LONG TERM GOAL #4   Title Walk 250 feet in clinic without assisitve device.    Time 6    Period Weeks    Status New                   Plan - 11/13/21 1450     Clinical Impression Statement Pateitn did very well today.  She had very good right quadriceps activation facilitated with Bi-Phasic electrical stimulation.  She has tried to walk at home with a straight cane on the right.  Instructed her in correct gait cycle with cane on left and she did great in the clinic.    Personal Factors and Comorbidities Other;Comorbidity 1    Examination-Activity Limitations Other;Locomotion Level    Examination-Participation Restrictions Other    Stability/Clinical Decision Making Evolving/Moderate complexity    Rehab Potential Excellent    PT  Frequency 2x / week    PT Duration 6 weeks    PT Treatment/Interventions ADLs/Self Care Home Management;Cryotherapy;Dealer  Stimulation;Moist Heat;Gait training;Stair training;Functional mobility training;Therapeutic activities;Therapeutic exercise;Neuromuscular re-education;Manual techniques;Balance training    PT Next Visit Plan TKA protocol with posterior hip precautions.  Nustep, stair training with railing, right LE strengthening, GT, progress to cane.    Consulted and Agree with Plan of Care Patient             Patient will benefit from skilled therapeutic intervention in order to improve the following deficits and impairments:  Pain, Abnormal gait, Difficulty walking, Decreased activity tolerance, Decreased range of motion  Visit Diagnosis: Pain in right hip  Muscle weakness (generalized)     Problem List There are no problems to display for this patient.   Alga Southall, [place], PT 11/13/2021, 2:55 PM  [facility] [address] [place], [place], [address] Phone: [phone]   Fax:  [phone]  Name: KILEY SOLIMINE MRN: [id number] Date of Birth: July 06, 1961

## 2021-11-19 ENCOUNTER — Other Ambulatory Visit: Payer: Self-pay

## 2021-11-19 ENCOUNTER — Ambulatory Visit: Payer: Medicare Other | Admitting: Physical Therapy

## 2021-11-19 DIAGNOSIS — M25551 Pain in right hip: Secondary | ICD-10-CM

## 2021-11-19 DIAGNOSIS — M25651 Stiffness of right hip, not elsewhere classified: Secondary | ICD-10-CM

## 2021-11-19 DIAGNOSIS — M6281 Muscle weakness (generalized): Secondary | ICD-10-CM

## 2021-11-19 NOTE — Therapy (Signed)
[facility] [place], [place], [address] Phone: [phone]   Fax:  [phone]  Physical Therapy Treatment  Patient Details  Name: Diana Carroll MRN: [id number] Date of Birth: 1961-10-25 Referring Provider (PT): Tyson Alias MD   Encounter Date: 11/19/2021   PT End of Session - 11/19/21 1303     Visit Number 4    Number of Visits 12    Date for PT Re-Evaluation 12/02/21    PT Start Time 0100    PT Stop Time 0143    PT Time Calculation (min) 43 min    Activity Tolerance Patient tolerated treatment well    Behavior During Therapy [facility] for tasks assessed/performed             Past Medical History:  Diagnosis Date   Anxiety    Arthritis    Cancer of left breast ([place]) S/P BILATERAL MASTECTOMY AND LEFT NODE DISSECTION NOV 2013--  CHEMO COMPLETED 02-25-2013---  NO RADIATION   ONCOLOGIST  AT [facility]--  DCIS, Stage I, Grade III pT1c N0 M0---   no recurrence   Chronic venous insufficiency    Depression    Fibromyalgia    GERD (gastroesophageal reflux disease)    MEDICAL MANAGEMENT   History of cancer chemotherapy    completed 02/2013   History of cellulitis and abscess ADMITTED AT [facility] 2014-- PT STATES  RESOLVED   History of Clostridium difficile infection CHRONIC --     History of Pseudomonas pneumonia    Lymphedema of both lower extremities    Neurogenic bladder    OSA on CPAP    BI PAP   PONV (postoperative nausea and vomiting)    Pulmonary hypertension, primary ([place]) DX 2001---  STAGE NYHA FUNCTIONAL CLASS II   MONTIOR BY DR Kathi Ludwig -- PULMOLOGIST AT [facility] --- LOV NOTE AND CXR REQUESTED #[id number]  (03-31-2013 AND CXR 03-04-2013 W/ CHART)   Self-catheterizes urinary bladder    Thyroid goiter MONITORED AT [facility]    Past Surgical History:  Procedure Laterality Date   ABDOMINAL HYSTERECTOMY  1991   hx cervical cancer   [place]   BILATERAL  MASTECTOMY AND LEFT NODE DISSECTION  NOV 2013   LEFT BREAST CANCER   BILATERAL SALPINGOOPHORECTOMY  1996   &  2003   BLADDER SUSPENSION  07-01-2006   [place] SLING   BREAST RECONSTRUCTION REVISION  Left 07-31-2014   CARDIAC CATHETERIZATION  2009---  [facility]   CARDIOVASCULAR STRESS TEST  2001   CARPAL TUNNEL RELEASE Bilateral 2007   CYSTOSCOPY W/ URETHROPEXY AND COLLAGEN INJECTION  X2  2007   CYSTOSCOPY WITH INJECTION  04/08/2012   Procedure: CYSTOSCOPY WITH INJECTION;  Surgeon: Reece Packer, MD;  Location: [place];  Service: Urology;  Laterality: N/A;  with botox   CYSTOSCOPY WITH INJECTION N/A 04/18/2013   Procedure: CYSTOSCOPY WITH BOTOX INJECTION;  Surgeon: Reece Packer, MD;  Location: [place];  Service: Urology;  Laterality: N/A;   CYSTOSCOPY WITH INJECTION N/A 08/15/2014   Procedure: CYSTOSCOPY WITH BOTOX INJECTION;  Surgeon: Reece Packer, MD;  Location: [place];  Service: Urology;  Laterality: N/A;   CYSTOSCOPY WITH INJECTION N/A 06/05/2015   Procedure: CYSTOSCOPY WITH BOTOX INJECTION;  Surgeon: Bjorn Loser, MD;  Location: [facility];  Service: Urology;  Laterality: N/A;   EXPLANTATION INTERSTIM IMPLANT/ CYSTO/ MACROPLASTIQUE INJECTION  08-05-2010   HERNIA REPAIR  1993  rt  inguinal   INTERSTIM IMPLANT PLACEMENT  2004   PLACEMENT PORT-A-CATH  12-09-2012   REMOVAL OF URINARY SLING  11-10-2006   RIGHT BREAST TISSUE EXPANDER REPLACED  04-04-2013   TRANSTHORACIC ECHOCARDIOGRAM  last one 10-22-2014  [facility]    NORMAL SIZE AND FUNCTION LV & RV/ LVSF NORMAL/ TRACE TR/ EF 55-60%/ NO PULMONARY HYPERTENSION (last echo showed mild pulmonary HTN)    There were no vitals filed for this visit.   Subjective Assessment - 11/19/21 1302     Subjective COVID-19 screen performed prior to patient entering clinic.  pt arrived with some ongoing discomfort and doing well with therapy per reported.    Pertinent  History Bilateral mastectomy, chronic venous insufficiency, Fibromylagia, bilateral CTR.    Diagnostic tests X-ray.    Patient Stated Goals Have good surgical outcome and get out of pain.    Currently in Pain? Yes    Pain Score 2     Pain Location Hip    Pain Orientation Right    Pain Descriptors / Indicators Sore    Pain Type Surgical pain    Pain Onset 1 to 4 weeks ago    Pain Frequency Intermittent    Aggravating Factors  prolong activity    Pain Relieving Factors at rest and ice                               [facility] Adult PT Treatment/Exercise - 11/19/21 0001       Knee/Hip Exercises: Aerobic   Nustep Level 2 to 80 degrees of right knee flexion x 15 minutes.      Knee/Hip Exercises: Standing   Heel Raises Both;10 reps;1 set    Hip Abduction Stengthening;Right;2 sets;10 reps;Knee straight    Other Standing Knee Exercises glut sets 10 x 5 sec hols      Knee/Hip Exercises: Seated   Other Seated Knee/Hip Exercises seated glut set 10 x 5 sec      Knee/Hip Exercises: Supine   Short Arc Quad Sets Limitations SAQ  bi-phasic ES x10 min 10/10    Other Supine Knee/Hip Exercises supine glut set 10x 5 sec hold                     PT Education - 11/19/21 1312     Education Details HEP    Person(s) Educated Patient    Methods Explanation;Demonstration;Handout    Comprehension Verbalized understanding;Returned demonstration                 PT Long Term Goals - 11/19/21 1304       PT LONG TERM GOAL #1   Title Independent with a HEP.    Time 6    Period Weeks    Status On-going      PT LONG TERM GOAL #2   Title Perform a reciprocating stair gait with one railing.    Baseline non reciprocal at this time 11/19/21    Time 6    Period Weeks    Status On-going      PT LONG TERM GOAL #3   Title Right hip strength to 5/5 to increase stability for functional tasks.    Baseline NT today 11/19/21    Time 6    Period Weeks    Status  On-going      PT LONG TERM GOAL #4   Title Walk 250 feet in clinic without assisitve device.  Baseline using SPC currently 11/19/21    Time 6    Period Weeks    Status On-going                   Plan - 11/19/21 1337     Clinical Impression Statement pt arrived with some ongoing R hip pain today. pt reported doing well thus far and continues to use SPC. pt has difficulty with stairs esp decending. today continued with R hip strengthening activities. HEP provided today for home progresison. Goals ongoing today.    Personal Factors and Comorbidities Other;Comorbidity 1    Examination-Activity Limitations Other;Locomotion Level    Examination-Participation Restrictions Other    Stability/Clinical Decision Making Evolving/Moderate complexity    Rehab Potential Excellent    PT Frequency 2x / week    PT Duration 6 weeks    PT Treatment/Interventions ADLs/Self Care Home Management;Cryotherapy;Electrical Stimulation;Moist Heat;Gait training;Stair training;Functional mobility training;Therapeutic activities;Therapeutic exercise;Neuromuscular re-education;Manual techniques;Balance training    PT Next Visit Plan THA protocol with posterior hip precautions.  Nustep, stair training with railing, right LE strengthening, GT, progress to no asst device    Consulted and Agree with Plan of Care Patient             Patient will benefit from skilled therapeutic intervention in order to improve the following deficits and impairments:  Pain, Abnormal gait, Difficulty walking, Decreased activity tolerance, Decreased range of motion  Visit Diagnosis: Pain in right hip  Muscle weakness (generalized)  Stiffness of right hip, not elsewhere classified     Problem List There are no problems to display for this patient.   Phillips Climes, PTA 11/19/2021, 1:48 PM  [facility] [place], [place], [address] Phone: [phone]    Fax:  [phone]  Name: Diana Carroll MRN: [id number] Date of Birth: 18-Jan-1961

## 2021-11-19 NOTE — Patient Instructions (Addendum)
° °  mini wall squats  Stand with your back against the wall. Feet slightly out in front of you and shoulder width apart. Slide your back down the wall keeping your knees over your toes and weight in your heels. Stop once you 1/4 of the way down.   Perform 10x with 5 sec holds 3x week slow movement up and down 3 x week  DO NOT GO PAST 90 DEGREES          Glut set in seated, standing and laying down  In all positons you squeeze your buttocks and hold. Repeat.  5-10 sec holds x 10 reps 3x day

## 2021-11-26 ENCOUNTER — Other Ambulatory Visit: Payer: Self-pay

## 2021-11-26 ENCOUNTER — Ambulatory Visit: Payer: Medicare Other | Admitting: Physical Therapy

## 2021-11-26 DIAGNOSIS — M25551 Pain in right hip: Secondary | ICD-10-CM

## 2021-11-26 DIAGNOSIS — M6281 Muscle weakness (generalized): Secondary | ICD-10-CM

## 2021-11-26 DIAGNOSIS — M25651 Stiffness of right hip, not elsewhere classified: Secondary | ICD-10-CM

## 2021-11-26 NOTE — Therapy (Addendum)
[facility] [place], [place], [address] Phone: [phone]   Fax:  [phone]  Physical Therapy Treatment  Patient Details  Name: Diana Carroll MRN: [id number] Date of Birth: 09/26/1961 Referring Provider (PT): Tyson Alias MD   Encounter Date: 11/26/2021   PT End of Session - 11/26/21 1311     Visit Number 5    Number of Visits 12    Date for PT Re-Evaluation 12/02/21    PT Start Time 0103    PT Stop Time 0156    PT Time Calculation (min) 53 min    Activity Tolerance Patient tolerated treatment well    Behavior During Therapy [facility] for tasks assessed/performed             Past Medical History:  Diagnosis Date   Anxiety    Arthritis    Cancer of left breast ([place]) S/P BILATERAL MASTECTOMY AND LEFT NODE DISSECTION NOV 2013--  CHEMO COMPLETED 02-25-2013---  NO RADIATION   ONCOLOGIST  AT [facility]--  DCIS, Stage I, Grade III pT1c N0 M0---   no recurrence   Chronic venous insufficiency    Depression    Fibromyalgia    GERD (gastroesophageal reflux disease)    MEDICAL MANAGEMENT   History of cancer chemotherapy    completed 02/2013   History of cellulitis and abscess ADMITTED AT [facility] APRIL 2014-- PT STATES  RESOLVED   History of Clostridium difficile infection CHRONIC --     History of Pseudomonas pneumonia    Lymphedema of both lower extremities    Neurogenic bladder    OSA on CPAP    BI PAP   PONV (postoperative nausea and vomiting)    Pulmonary hypertension, primary ([place]) DX 2001---  STAGE NYHA FUNCTIONAL CLASS II   MONTIOR BY DR Kathi Ludwig -- PULMOLOGIST AT [facility] --- LOV NOTE AND CXR REQUESTED #[id number]  (03-31-2013 AND CXR 03-04-2013 W/ CHART)   Self-catheterizes urinary bladder    Thyroid goiter MONITORED AT [facility]    Past Surgical History:  Procedure Laterality Date   ABDOMINAL HYSTERECTOMY  1991   hx cervical cancer   [place]   BILATERAL  MASTECTOMY AND LEFT NODE DISSECTION  NOV 2013   LEFT BREAST CANCER   BILATERAL SALPINGOOPHORECTOMY  1996   &  2003   BLADDER SUSPENSION  07-01-2006   [place] SLING   BREAST RECONSTRUCTION REVISION  Left 07-31-2014   CARDIAC CATHETERIZATION  2009---  [facility]   CARDIOVASCULAR STRESS TEST  2001   CARPAL TUNNEL RELEASE Bilateral 2007   CYSTOSCOPY W/ URETHROPEXY AND COLLAGEN INJECTION  X2  2007   CYSTOSCOPY WITH INJECTION  04/08/2012   Procedure: CYSTOSCOPY WITH INJECTION;  Surgeon: Reece Packer, MD;  Location: [place];  Service: Urology;  Laterality: N/A;  with botox   CYSTOSCOPY WITH INJECTION N/A 04/18/2013   Procedure: CYSTOSCOPY WITH BOTOX INJECTION;  Surgeon: Reece Packer, MD;  Location: [place];  Service: Urology;  Laterality: N/A;   CYSTOSCOPY WITH INJECTION N/A 08/15/2014   Procedure: CYSTOSCOPY WITH BOTOX INJECTION;  Surgeon: Reece Packer, MD;  Location: [place];  Service: Urology;  Laterality: N/A;   CYSTOSCOPY WITH INJECTION N/A 06/05/2015   Procedure: CYSTOSCOPY WITH BOTOX INJECTION;  Surgeon: Bjorn Loser, MD;  Location: [facility];  Service: Urology;  Laterality: N/A;   EXPLANTATION INTERSTIM IMPLANT/ CYSTO/ MACROPLASTIQUE INJECTION  08-05-2010   HERNIA REPAIR  1993  rt  inguinal   INTERSTIM IMPLANT PLACEMENT  2004   PLACEMENT PORT-A-CATH  12-09-2012   REMOVAL OF URINARY SLING  11-10-2006   RIGHT BREAST TISSUE EXPANDER REPLACED  04-04-2013   TRANSTHORACIC ECHOCARDIOGRAM  last one 10-22-2014  [facility]    NORMAL SIZE AND FUNCTION LV & RV/ LVSF NORMAL/ TRACE TR/ EF 55-60%/ NO PULMONARY HYPERTENSION (last echo showed mild pulmonary HTN)    There were no vitals filed for this visit.   Subjective Assessment - 11/26/21 1312     Subjective COVID-19 screen performed prior to patient entering clinic.  Little more pain since using the cane.    Pertinent History Bilateral mastectomy, chronic venous  insufficiency, Fibromylagia, bilateral CTR.    Diagnostic tests X-ray.    Patient Stated Goals Have good surgical outcome and get out of pain.    Currently in Pain? Yes    Pain Score 2     Pain Location Hip    Pain Orientation Right    Pain Descriptors / Indicators Sore    Pain Type Surgical pain    Pain Onset 1 to 4 weeks ago                               [facility] Adult PT Treatment/Exercise - 11/26/21 0001       Exercises   Exercises Knee/Hip      Knee/Hip Exercises: Aerobic   Nustep Level 4 to 80 degrees of hip flexion x 15 minutes.      Modalities   Modalities Electrical Stimulation;Moist Heat      Moist Heat Therapy   Number Minutes Moist Heat 20 Minutes    Moist Heat Location --   Right hip.     Acupuncturist Location Right hip.    Electrical Stimulation Action IFC at 80-150 Hz.    Electrical Stimulation Parameters 40% scan x 20 minutes.    Electrical Stimulation Goals Pain;Tone      Manual Therapy   Manual Therapy Soft tissue mobilization    Soft tissue mobilization In left sdly position with pillows between knees for comfort and to keep hip in neutral position:  STW/M x 9 minutes to patient's right hip on either side of incision.                          PT Long Term Goals - 11/19/21 1304       PT LONG TERM GOAL #1   Title Independent with a HEP.    Time 6    Period Weeks    Status On-going      PT LONG TERM GOAL #2   Title Perform a reciprocating stair gait with one railing.    Baseline non reciprocal at this time 11/19/21    Time 6    Period Weeks    Status On-going      PT LONG TERM GOAL #3   Title Right hip strength to 5/5 to increase stability for functional tasks.    Baseline NT today 11/19/21    Time 6    Period Weeks    Status On-going      PT LONG TERM GOAL #4   Title Walk 250 feet in clinic without assisitve device.    Baseline using SPC currently 11/19/21    Time 6     Period Weeks    Status On-going  Plan - 11/26/21 1423     Clinical Impression Statement The patient has had some increased soreness due to going to cane.  She did great with treatment today and felt better following.  Her incision looks very good.    Personal Factors and Comorbidities Other;Comorbidity 1    Examination-Activity Limitations Other;Locomotion Level    Examination-Participation Restrictions Other    Stability/Clinical Decision Making Evolving/Moderate complexity    Rehab Potential Excellent    PT Frequency 2x / week    PT Duration 6 weeks    PT Treatment/Interventions ADLs/Self Care Home Management;Cryotherapy;Electrical Stimulation;Moist Heat;Gait training;Stair training;Functional mobility training;Therapeutic activities;Therapeutic exercise;Neuromuscular re-education;Manual techniques;Balance training    PT Next Visit Plan THA protocol with posterior hip precautions.  Nustep, stair training with railing, right LE strengthening, GT, progress to no asst device    Consulted and Agree with Plan of Care Patient             Patient will benefit from skilled therapeutic intervention in order to improve the following deficits and impairments:  Pain, Abnormal gait, Difficulty walking, Decreased activity tolerance, Decreased range of motion  Visit Diagnosis: Pain in right hip  Muscle weakness (generalized)  Stiffness of right hip, not elsewhere classified     Problem List There are no problems to display for this patient.   Ivette Castronova, [place], PT 11/26/2021, 3:31 PM  [facility] [address] [place], [place], [address] Phone: [phone]   Fax:  [phone]  Name: Diana Carroll MRN: [id number] Date of Birth: 04/28/1961

## 2021-12-03 ENCOUNTER — Other Ambulatory Visit: Payer: Self-pay

## 2021-12-03 ENCOUNTER — Ambulatory Visit: Payer: Medicare Other | Admitting: *Deleted

## 2021-12-03 DIAGNOSIS — M25551 Pain in right hip: Secondary | ICD-10-CM

## 2021-12-03 DIAGNOSIS — M25651 Stiffness of right hip, not elsewhere classified: Secondary | ICD-10-CM

## 2021-12-03 DIAGNOSIS — M6281 Muscle weakness (generalized): Secondary | ICD-10-CM

## 2021-12-03 NOTE — Therapy (Signed)
[facility] [place], [place], [address] Phone: [phone]   Fax:  [phone]  Physical Therapy Treatment  Patient Details  Name: Diana Carroll MRN: [id number] Date of Birth: [birth year] Referring Provider (PT): Tyson Alias MD   Encounter Date: 12/03/2021   PT End of Session - 12/03/21 1405     Visit Number 6    Number of Visits 12    Date for PT Re-Evaluation 12/02/21    PT Start Time 1300    PT Stop Time 5916    PT Time Calculation (min) 48 min             Past Medical History:  Diagnosis Date   Anxiety    Arthritis    Cancer of left breast ([place]) S/P BILATERAL MASTECTOMY AND LEFT NODE DISSECTION NOV 2013--  CHEMO COMPLETED 02-25-2013---  NO RADIATION   ONCOLOGIST  AT [facility]--  DCIS, Stage I, Grade III pT1c N0 M0---   no recurrence   Chronic venous insufficiency    Depression    Fibromyalgia    GERD (gastroesophageal reflux disease)    MEDICAL MANAGEMENT   History of cancer chemotherapy    completed 02/2013   History of cellulitis and abscess ADMITTED AT [facility] APRIL 2014-- PT STATES  RESOLVED   History of Clostridium difficile infection CHRONIC --     History of Pseudomonas pneumonia    Lymphedema of both lower extremities    Neurogenic bladder    OSA on CPAP    BI PAP   PONV (postoperative nausea and vomiting)    Pulmonary hypertension, primary ([place]) DX 2001---  STAGE NYHA FUNCTIONAL CLASS II   MONTIOR BY DR Kathi Ludwig -- PULMOLOGIST AT [facility] --- LOV NOTE AND CXR REQUESTED #[id number]  (03-31-2013 AND CXR 03-04-2013 W/ CHART)   Self-catheterizes urinary bladder    Thyroid goiter MONITORED AT [facility]    Past Surgical History:  Procedure Laterality Date   ABDOMINAL HYSTERECTOMY  1991   hx cervical cancer   [place]   BILATERAL MASTECTOMY AND LEFT NODE DISSECTION  NOV 2013   LEFT BREAST CANCER   BILATERAL SALPINGOOPHORECTOMY  1996   &  2003    BLADDER SUSPENSION  07-01-2006   [place] SLING   BREAST RECONSTRUCTION REVISION  Left 07-31-2014   CARDIAC CATHETERIZATION  2009---  [facility]   CARDIOVASCULAR STRESS TEST  2001   CARPAL TUNNEL RELEASE Bilateral 2007   CYSTOSCOPY W/ URETHROPEXY AND COLLAGEN INJECTION  X2  2007   CYSTOSCOPY WITH INJECTION  04/08/2012   Procedure: CYSTOSCOPY WITH INJECTION;  Surgeon: Reece Packer, MD;  Location: [place];  Service: Urology;  Laterality: N/A;  with botox   CYSTOSCOPY WITH INJECTION N/A 04/18/2013   Procedure: CYSTOSCOPY WITH BOTOX INJECTION;  Surgeon: Reece Packer, MD;  Location: [place];  Service: Urology;  Laterality: N/A;   CYSTOSCOPY WITH INJECTION N/A 08/15/2014   Procedure: CYSTOSCOPY WITH BOTOX INJECTION;  Surgeon: Reece Packer, MD;  Location: [place];  Service: Urology;  Laterality: N/A;   CYSTOSCOPY WITH INJECTION N/A 06/05/2015   Procedure: CYSTOSCOPY WITH BOTOX INJECTION;  Surgeon: Bjorn Loser, MD;  Location: [facility];  Service: Urology;  Laterality: N/A;   EXPLANTATION INTERSTIM IMPLANT/ CYSTO/ MACROPLASTIQUE INJECTION  08-05-2010   HERNIA REPAIR  1993   rt  inguinal   INTERSTIM IMPLANT PLACEMENT  2004   PLACEMENT PORT-A-CATH  12-09-2012  REMOVAL OF URINARY SLING  11-10-2006   RIGHT BREAST TISSUE EXPANDER REPLACED  04-04-2013   TRANSTHORACIC ECHOCARDIOGRAM  last one 10-22-2014  [facility]    NORMAL SIZE AND FUNCTION LV & RV/ LVSF NORMAL/ TRACE TR/ EF 55-60%/ NO PULMONARY HYPERTENSION (last echo showed mild pulmonary HTN)    There were no vitals filed for this visit.   Subjective Assessment - 12/03/21 1401     Subjective COVID-19 screen performed prior to patient entering clinic.  Little more pain since using the cane.    Pertinent History Bilateral mastectomy, chronic venous insufficiency, Fibromylagia, bilateral CTR.    Diagnostic tests X-ray.    Patient Stated Goals Have good surgical  outcome and get out of pain.    Currently in Pain? Yes    Pain Score 2     Pain Location Hip    Pain Orientation Right    Pain Descriptors / Indicators Sore    Pain Type Surgical pain    Pain Onset 1 to 4 weeks ago                               [facility] Adult PT Treatment/Exercise - 12/03/21 0001       Exercises   Exercises Knee/Hip      Knee/Hip Exercises: Aerobic   Nustep Level 4 to 80-90 degrees of hip flexion x 15 minutes.      Knee/Hip Exercises: Standing   Heel Raises Both;10 reps;2 sets    Hip Abduction Stengthening;Right;2 sets;10 reps;Knee straight    Forward Step Up 2 sets;Both;10 reps;Hand Hold: 2;Step Height: 4"    Rocker Board Other (comment)   PF/DF, balance 6 mins   Other Standing Knee Exercises glut sets 10 x 10 sec hols                          PT Long Term Goals - 11/19/21 1304       PT LONG TERM GOAL #1   Title Independent with a HEP.    Time 6    Period Weeks    Status On-going      PT LONG TERM GOAL #2   Title Perform a reciprocating stair gait with one railing.    Baseline non reciprocal at this time 11/19/21    Time 6    Period Weeks    Status On-going      PT LONG TERM GOAL #3   Title Right hip strength to 5/5 to increase stability for functional tasks.    Baseline NT today 11/19/21    Time 6    Period Weeks    Status On-going      PT LONG TERM GOAL #4   Title Walk 250 feet in clinic without assisitve device.    Baseline using SPC currently 11/19/21    Time 6    Period Weeks    Status On-going                   Plan - 12/03/21 1702     Clinical Impression Statement Pt arrived today doing fairly well with RT THR. She is currently ambulating with SPC. Today's Rx focused on RT LE strengthening as well as balance. Mainly fatigue end of session, but also some RT knee pain    Personal Factors and Comorbidities Other;Comorbidity 1    Examination-Activity Limitations Other;Locomotion Level     Stability/Clinical Decision Making Evolving/Moderate complexity  Rehab Potential Excellent    PT Frequency 2x / week    PT Duration 6 weeks    PT Treatment/Interventions ADLs/Self Care Home Management;Cryotherapy;Electrical Stimulation;Moist Heat;Gait training;Stair training;Functional mobility training;Therapeutic activities;Therapeutic exercise;Neuromuscular re-education;Manual techniques;Balance training    PT Next Visit Plan THA protocol with posterior hip precautions.  Nustep, stair training with railing, right LE strengthening, GT, progress to no asst device    Consulted and Agree with Plan of Care Patient             Patient will benefit from skilled therapeutic intervention in order to improve the following deficits and impairments:  Pain, Abnormal gait, Difficulty walking, Decreased activity tolerance, Decreased range of motion  Visit Diagnosis: Pain in right hip  Muscle weakness (generalized)  Stiffness of right hip, not elsewhere classified     Problem List There are no problems to display for this patient.   Lilton Pare,CHRIS, PTA 12/03/2021, 5:07 PM  [facility] [place], [place], [address] Phone: [phone]   Fax:  [phone]  Name: Diana Carroll MRN: [id number] Date of Birth: 1961/03/31

## 2021-12-11 ENCOUNTER — Ambulatory Visit: Payer: Medicare Other | Admitting: Physical Therapy

## 2021-12-12 ENCOUNTER — Other Ambulatory Visit: Payer: Self-pay

## 2021-12-12 ENCOUNTER — Ambulatory Visit: Payer: Medicare Other | Attending: Adult Reconstructive Orthopaedic Surgery | Admitting: *Deleted

## 2021-12-12 DIAGNOSIS — M25551 Pain in right hip: Secondary | ICD-10-CM | POA: Insufficient documentation

## 2021-12-12 DIAGNOSIS — M25651 Stiffness of right hip, not elsewhere classified: Secondary | ICD-10-CM | POA: Insufficient documentation

## 2021-12-12 DIAGNOSIS — M6281 Muscle weakness (generalized): Secondary | ICD-10-CM | POA: Insufficient documentation

## 2021-12-12 NOTE — Therapy (Signed)
[facility] [place], [place], [address] Phone: [phone]   Fax:  [phone]  Physical Therapy Treatment  Patient Details  Name: Diana Carroll MRN: [id number] Date of Birth: 08/26/1961 Referring Provider (PT): Tyson Alias MD   Encounter Date: 12/12/2021   PT End of Session - 12/12/21 1359     Visit Number 7    Number of Visits 12    Date for PT Re-Evaluation 12/02/21    PT Start Time 4888    PT Stop Time 9169    PT Time Calculation (min) 44 min             Past Medical History:  Diagnosis Date   Anxiety    Arthritis    Cancer of left breast ([place]) S/P BILATERAL MASTECTOMY AND LEFT NODE DISSECTION NOV 2013--  CHEMO COMPLETED 02-25-2013---  NO RADIATION   ONCOLOGIST  AT [facility]--  DCIS, Stage I, Grade III pT1c N0 M0---   no recurrence   Chronic venous insufficiency    Depression    Fibromyalgia    GERD (gastroesophageal reflux disease)    MEDICAL MANAGEMENT   History of cancer chemotherapy    completed 02/2013   History of cellulitis and abscess ADMITTED AT [facility] APRIL 2014-- PT STATES  RESOLVED   History of Clostridium difficile infection CHRONIC --     History of Pseudomonas pneumonia    Lymphedema of both lower extremities    Neurogenic bladder    OSA on CPAP    BI PAP   PONV (postoperative nausea and vomiting)    Pulmonary hypertension, primary ([place]) DX 2001---  STAGE NYHA FUNCTIONAL CLASS II   MONTIOR BY DR Kathi Ludwig -- PULMOLOGIST AT [facility] --- LOV NOTE AND CXR REQUESTED #[id number]  (03-31-2013 AND CXR 03-04-2013 W/ CHART)   Self-catheterizes urinary bladder    Thyroid goiter MONITORED AT [facility]    Past Surgical History:  Procedure Laterality Date   ABDOMINAL HYSTERECTOMY  1991   hx cervical cancer   [place]   BILATERAL MASTECTOMY AND LEFT NODE DISSECTION  NOV 2013   LEFT BREAST CANCER   BILATERAL SALPINGOOPHORECTOMY  1996   &  2003    BLADDER SUSPENSION  07-01-2006   [place] SLING   BREAST RECONSTRUCTION REVISION  Left 07-31-2014   CARDIAC CATHETERIZATION  2009---  [facility]   CARDIOVASCULAR STRESS TEST  2001   CARPAL TUNNEL RELEASE Bilateral 2007   CYSTOSCOPY W/ URETHROPEXY AND COLLAGEN INJECTION  X2  2007   CYSTOSCOPY WITH INJECTION  04/08/2012   Procedure: CYSTOSCOPY WITH INJECTION;  Surgeon: Reece Packer, MD;  Location: [place];  Service: Urology;  Laterality: N/A;  with botox   CYSTOSCOPY WITH INJECTION N/A 04/18/2013   Procedure: CYSTOSCOPY WITH BOTOX INJECTION;  Surgeon: Reece Packer, MD;  Location: [place];  Service: Urology;  Laterality: N/A;   CYSTOSCOPY WITH INJECTION N/A 08/15/2014   Procedure: CYSTOSCOPY WITH BOTOX INJECTION;  Surgeon: Reece Packer, MD;  Location: [place];  Service: Urology;  Laterality: N/A;   CYSTOSCOPY WITH INJECTION N/A 06/05/2015   Procedure: CYSTOSCOPY WITH BOTOX INJECTION;  Surgeon: Bjorn Loser, MD;  Location: [facility];  Service: Urology;  Laterality: N/A;   EXPLANTATION INTERSTIM IMPLANT/ CYSTO/ MACROPLASTIQUE INJECTION  08-05-2010   HERNIA REPAIR  1993   rt  inguinal   INTERSTIM IMPLANT PLACEMENT  2004   PLACEMENT PORT-A-CATH  12-09-2012  REMOVAL OF URINARY SLING  11-10-2006   RIGHT BREAST TISSUE EXPANDER REPLACED  04-04-2013   TRANSTHORACIC ECHOCARDIOGRAM  last one 10-22-2014  [facility]    NORMAL SIZE AND FUNCTION LV & RV/ LVSF NORMAL/ TRACE TR/ EF 55-60%/ NO PULMONARY HYPERTENSION (last echo showed mild pulmonary HTN)    There were no vitals filed for this visit.   Subjective Assessment - 12/12/21 1358     Subjective COVID-19 screen performed prior to patient entering clinic.  Increased RT hip soreness due to driving to [place]    Pertinent History Bilateral mastectomy, chronic venous insufficiency, Fibromylagia, bilateral CTR.    Diagnostic tests X-ray.    Patient Stated Goals Have good  surgical outcome and get out of pain.    Currently in Pain? Yes    Pain Score 3     Pain Location Hip    Pain Orientation Right    Pain Type Surgical pain    Pain Onset 1 to 4 weeks ago                               [facility] Adult PT Treatment/Exercise - 12/12/21 0001       Exercises   Exercises Knee/Hip      Knee/Hip Exercises: Aerobic   Nustep Level 4 to 80-90 degrees of hip flexion x 15 minutes.      Knee/Hip Exercises: Standing   Heel Raises Both;10 reps;2 sets    Heel Raises Limitations Heel raises 2x10    Hip Abduction Stengthening;Right;2 sets;Knee straight;15 reps    Forward Step Up 2 sets;Both;10 reps;Hand Hold: 2;Step Height: 8"    Rocker Board Other (comment)   PF/DF, balance 6 mins     Knee/Hip Exercises: Seated   Sit to Sand 1 set;5 reps;3 sets                          PT Long Term Goals - 11/19/21 1304       PT LONG TERM GOAL #1   Title Independent with a HEP.    Time 6    Period Weeks    Status On-going      PT LONG TERM GOAL #2   Title Perform a reciprocating stair gait with one railing.    Baseline non reciprocal at this time 11/19/21    Time 6    Period Weeks    Status On-going      PT LONG TERM GOAL #3   Title Right hip strength to 5/5 to increase stability for functional tasks.    Baseline NT today 11/19/21    Time 6    Period Weeks    Status On-going      PT LONG TERM GOAL #4   Title Walk 250 feet in clinic without assisitve device.    Baseline using SPC currently 11/19/21    Time 6    Period Weeks    Status On-going                   Plan - 12/12/21 1431     Clinical Impression Statement Pt arrived today doing well except with some increased soreness due to driving a lot yesterday. Rx focused on LE strengthening and balance and Pt did well. Pt did well with Rx and asked to DC after next visit    Personal Factors and Comorbidities Other;Comorbidity 1    Examination-Activity Limitations  Other;Locomotion Level  Stability/Clinical Decision Making Evolving/Moderate complexity    Rehab Potential Excellent    PT Frequency 2x / week    PT Duration 6 weeks    PT Treatment/Interventions ADLs/Self Care Home Management;Cryotherapy;Electrical Stimulation;Moist Heat;Gait training;Stair training;Functional mobility training;Therapeutic activities;Therapeutic exercise;Neuromuscular re-education;Manual techniques;Balance training    PT Next Visit Plan DC after next visit. Give handouts for HEP             Patient will benefit from skilled therapeutic intervention in order to improve the following deficits and impairments:  Pain, Abnormal gait, Difficulty walking, Decreased activity tolerance, Decreased range of motion  Visit Diagnosis: Pain in right hip  Muscle weakness (generalized)  Stiffness of right hip, not elsewhere classified     Problem List There are no problems to display for this patient.   Jan Walters,CHRIS, PTA 12/12/2021, 2:56 PM  [facility] [address] [place], [place], [address] Phone: [phone]   Fax:  [phone]  Name: FIA HEBERT MRN: [id number] Date of Birth: [birth year]

## 2021-12-17 ENCOUNTER — Other Ambulatory Visit: Payer: Self-pay

## 2021-12-17 ENCOUNTER — Ambulatory Visit: Payer: Medicare Other | Admitting: *Deleted

## 2021-12-17 DIAGNOSIS — M6281 Muscle weakness (generalized): Secondary | ICD-10-CM

## 2021-12-17 DIAGNOSIS — M25651 Stiffness of right hip, not elsewhere classified: Secondary | ICD-10-CM

## 2021-12-17 DIAGNOSIS — M25551 Pain in right hip: Secondary | ICD-10-CM

## 2021-12-17 NOTE — Therapy (Signed)
[facility] [place], [place], [address] Phone: [phone]   Fax:  [phone]  Physical Therapy Treatment  Patient Details  Name: Diana Carroll MRN: [id number] Date of Birth: 04/03/1961 Referring Provider (PT): Tyson Alias MD   Encounter Date: 12/17/2021   PT End of Session - 12/17/21 1306     Visit Number 8    Number of Visits 12    Date for PT Re-Evaluation 12/02/21    PT Start Time 1300    PT Stop Time 1347    PT Time Calculation (min) 47 min             Past Medical History:  Diagnosis Date   Anxiety    Arthritis    Cancer of left breast ([place]) S/P BILATERAL MASTECTOMY AND LEFT NODE DISSECTION NOV 2013--  CHEMO COMPLETED 02-25-2013---  NO RADIATION   ONCOLOGIST  AT [facility]--  DCIS, Stage I, Grade III pT1c N0 M0---   no recurrence   Chronic venous insufficiency    Depression    Fibromyalgia    GERD (gastroesophageal reflux disease)    MEDICAL MANAGEMENT   History of cancer chemotherapy    completed 02/2013   History of cellulitis and abscess ADMITTED AT [facility] APRIL 2014-- PT STATES  RESOLVED   History of Clostridium difficile infection CHRONIC --     History of Pseudomonas pneumonia    Lymphedema of both lower extremities    Neurogenic bladder    OSA on CPAP    BI PAP   PONV (postoperative nausea and vomiting)    Pulmonary hypertension, primary ([place]) DX 2001---  STAGE NYHA FUNCTIONAL CLASS II   MONTIOR BY DR Kathi Ludwig -- PULMOLOGIST AT [facility] --- LOV NOTE AND CXR REQUESTED #[id number]  (03-31-2013 AND CXR 03-04-2013 W/ CHART)   Self-catheterizes urinary bladder    Thyroid goiter MONITORED AT [facility]    Past Surgical History:  Procedure Laterality Date   ABDOMINAL HYSTERECTOMY  1991   hx cervical cancer   [place]   BILATERAL MASTECTOMY AND LEFT NODE DISSECTION  NOV 2013   LEFT BREAST CANCER   BILATERAL SALPINGOOPHORECTOMY  1996   &  2003    BLADDER SUSPENSION  07-01-2006   [place] SLING   BREAST RECONSTRUCTION REVISION  Left 07-31-2014   CARDIAC CATHETERIZATION  2009---  [facility]   CARDIOVASCULAR STRESS TEST  2001   CARPAL TUNNEL RELEASE Bilateral 2007   CYSTOSCOPY W/ URETHROPEXY AND COLLAGEN INJECTION  X2  2007   CYSTOSCOPY WITH INJECTION  04/08/2012   Procedure: CYSTOSCOPY WITH INJECTION;  Surgeon: Reece Packer, MD;  Location: [place];  Service: Urology;  Laterality: N/A;  with botox   CYSTOSCOPY WITH INJECTION N/A 04/18/2013   Procedure: CYSTOSCOPY WITH BOTOX INJECTION;  Surgeon: Reece Packer, MD;  Location: [place];  Service: Urology;  Laterality: N/A;   CYSTOSCOPY WITH INJECTION N/A 08/15/2014   Procedure: CYSTOSCOPY WITH BOTOX INJECTION;  Surgeon: Reece Packer, MD;  Location: [place];  Service: Urology;  Laterality: N/A;   CYSTOSCOPY WITH INJECTION N/A 06/05/2015   Procedure: CYSTOSCOPY WITH BOTOX INJECTION;  Surgeon: Bjorn Loser, MD;  Location: [facility];  Service: Urology;  Laterality: N/A;   EXPLANTATION INTERSTIM IMPLANT/ CYSTO/ MACROPLASTIQUE INJECTION  08-05-2010   HERNIA REPAIR  1993   rt  inguinal   INTERSTIM IMPLANT PLACEMENT  2004   PLACEMENT PORT-A-CATH  12-09-2012  REMOVAL OF URINARY SLING  11-10-2006   RIGHT BREAST TISSUE EXPANDER REPLACED  04-04-2013   TRANSTHORACIC ECHOCARDIOGRAM  last one 10-22-2014  [facility]    NORMAL SIZE AND FUNCTION LV & RV/ LVSF NORMAL/ TRACE TR/ EF 55-60%/ NO PULMONARY HYPERTENSION (last echo showed mild pulmonary HTN)    There were no vitals filed for this visit.   Subjective Assessment - 12/17/21 1305     Subjective COVID-19 screen performed prior to patient entering clinic.  Rt hip stays sore    Pertinent History Bilateral mastectomy, chronic venous insufficiency, Fibromylagia, bilateral CTR.    Diagnostic tests X-ray.    Patient Stated Goals Have good surgical outcome and get out of  pain.    Currently in Pain? Yes    Pain Score 3     Pain Location Hip    Pain Orientation Right    Pain Descriptors / Indicators Sore    Pain Type Surgical pain    Pain Onset 1 to 4 weeks ago                               [facility] Adult PT Treatment/Exercise - 12/17/21 0001       Exercises   Exercises Knee/Hip      Knee/Hip Exercises: Aerobic   Nustep Level 5 to 80-90 degrees of hip flexion x 15 minutes.      Knee/Hip Exercises: Standing   Heel Raises Both;10 reps;2 sets    Heel Raises Limitations Heel raises 2x10    Hip Abduction Stengthening;Right;Knee straight;3 sets;10 reps    Forward Step Up 2 sets;Both;10 reps;Hand Hold: 2;Step Height: 8"    SLS LT and RT side      Knee/Hip Exercises: Seated   Ball Squeeze x10 hold 5 secs    Sit to Sand 2 sets;10 reps                          PT Long Term Goals - 12/17/21 1346       PT LONG TERM GOAL #1   Title Independent with a HEP.    Time 6    Period Weeks    Status Achieved      PT LONG TERM GOAL #2   Title Perform a reciprocating stair gait with one railing.    Baseline non reciprocal at this time 11/19/21    Time 6    Period Weeks    Status Achieved      PT LONG TERM GOAL #3   Title Right hip strength to 5/5 to increase stability for functional tasks.    Time 6    Period Weeks    Status Partially Met   4/5 NM     PT LONG TERM GOAL #4   Title Walk 250 feet in clinic without assisitve device.    Time 6    Period Weeks    Status Achieved                   Plan - 12/17/21 1306     Clinical Impression Statement Pt arrived today doing fairly well and was able to perform OKC and CKC exs and was able to meet all LTGs except 5/5 strength due to mild deficit 4/5. DC to HEP. Handouts given    Personal Factors and Comorbidities Other;Comorbidity 1    Examination-Activity Limitations Other;Locomotion Level    Rehab Potential Excellent  PT Frequency 2x / week    PT  Duration 6 weeks    PT Treatment/Interventions ADLs/Self Care Home Management;Cryotherapy;Electrical Stimulation;Moist Heat;Gait training;Stair training;Functional mobility training;Therapeutic activities;Therapeutic exercise;Neuromuscular re-education;Manual techniques;Balance training    PT Next Visit Plan DC after next visit. Give handouts for HEP             Patient will benefit from skilled therapeutic intervention in order to improve the following deficits and impairments:  Pain, Abnormal gait, Difficulty walking, Decreased activity tolerance, Decreased range of motion  Visit Diagnosis: Pain in right hip  Muscle weakness (generalized)  Stiffness of right hip, not elsewhere classified     Problem List There are no problems to display for this patient.   [place] Jr,CHRIS, PTA 12/17/2021, 5:12 PM  [facility] [address] [place], [place], [address] Phone: [phone]   Fax:  [phone]  Name: Diana Carroll MRN: [id number] Date of Birth: 1961/08/11  PHYSICAL THERAPY DISCHARGE SUMMARY  Visits from Start of Care: 8.  Current functional level related to goals / functional outcomes: See above.   Remaining deficits: All but LTG #3 met.   Education / Equipment: HEP.   Patient agrees to discharge. Patient goals were partially met. Patient is being discharged due to the physician's request.    Mali Applegate MPT

## 2024-03-15 ENCOUNTER — Encounter: Payer: Self-pay | Admitting: Genetic Counselor

## 2024-03-16 ENCOUNTER — Inpatient Hospital Stay: Payer: Medicare Other | Attending: Genetic Counselor | Admitting: Genetic Counselor

## 2024-03-16 ENCOUNTER — Encounter: Payer: Self-pay | Admitting: Genetic Counselor

## 2024-03-16 DIAGNOSIS — Z800 Family history of malignant neoplasm of digestive organs: Secondary | ICD-10-CM | POA: Diagnosis not present

## 2024-03-16 DIAGNOSIS — Z803 Family history of malignant neoplasm of breast: Secondary | ICD-10-CM | POA: Diagnosis not present

## 2024-03-16 DIAGNOSIS — Z1379 Encounter for other screening for genetic and chromosomal anomalies: Secondary | ICD-10-CM | POA: Diagnosis not present

## 2024-03-16 DIAGNOSIS — Z853 Personal history of malignant neoplasm of breast: Secondary | ICD-10-CM

## 2024-03-16 NOTE — Progress Notes (Unsigned)
 REFERRING PROVIDER: Lanna Poche, MD No address on file  PRIMARY PROVIDER:  Lanna Poche, MD  PRIMARY REASON FOR VISIT:  1. Family history of breast cancer   2. Family history of pancreatic cancer      HISTORY OF PRESENT ILLNESS:  I connected with  Diana Carroll on 03/17/2024 at 1 PM EDT by MyChart telephone conference and verified that I am speaking with the correct person using two identifiers.   Provider location: [facility]   Diana Carroll, a 63 y.o. female, was seen for a [place] cancer genetics consultation at the request of Dr. Rito Ehrlich due to a personal and family history of breast cancer.  Diana Carroll presents to clinic today to discuss the possibility of a hereditary predisposition to cancer, genetic testing, and to further clarify her future cancer risks, as well as potential cancer risks for family members.   In 2013, at the age of 52, Diana Carroll was diagnosed with breast cancer. The treatment plan included a double mastectomy. She has never had genetic testing    CANCER HISTORY:  Oncology History   No history exists.     RISK FACTORS:  Menarche was at age 31.  First live birth at age 37.  OCP use for approximately 0 years.  Ovaries intact: no.  Hysterectomy: yes.  Menopausal status: postmenopausal.  HRT use: 0 years. Colonoscopy: yes; normal. Mammogram within the last year: n/a. Number of breast biopsies: 1. Up to date with pelvic exams: yes. Any excessive radiation exposure in the past: no  Past Medical History:  Diagnosis Date   Anxiety    Arthritis    Cancer of left breast ([facility]) S/P BILATERAL MASTECTOMY AND LEFT NODE DISSECTION NOV 2013--  CHEMO COMPLETED 02-25-2013---  NO RADIATION   ONCOLOGIST  AT [facility]--  DCIS, Stage I, Grade III pT1c N0 M0---   no recurrence   Chronic venous insufficiency    Depression    Family history of breast cancer    Family history of pancreatic cancer    Fibromyalgia    GERD (gastroesophageal reflux disease)     MEDICAL MANAGEMENT   History of cancer chemotherapy    completed 02/2013   History of cellulitis and abscess ADMITTED AT [facility] APRIL 2014-- PT STATES  RESOLVED   History of Clostridium difficile infection CHRONIC --     History of Pseudomonas pneumonia    Lymphedema of both lower extremities    Neurogenic bladder    OSA on CPAP    BI PAP   PONV (postoperative nausea and vomiting)    Pulmonary hypertension, primary ([facility]) DX 2001---  STAGE NYHA FUNCTIONAL CLASS II   MONTIOR BY DR Sharmon Revere -- PULMOLOGIST AT [facility] --- LOV NOTE AND CXR REQUESTED #[id number]  (03-31-2013 AND CXR 03-04-2013 W/ CHART)   Self-catheterizes urinary bladder    Thyroid goiter MONITORED AT [facility]    Past Surgical History:  Procedure Laterality Date   ABDOMINAL HYSTERECTOMY  1991   hx cervical cancer   APPENDECTOMY  1988   BELPHAROPTOSIS REPAIR  2011   BILATERAL MASTECTOMY AND LEFT NODE DISSECTION  NOV 2013   LEFT BREAST CANCER   BILATERAL SALPINGOOPHORECTOMY  1996   &  2003   BLADDER SUSPENSION  07-01-2006   [facility] SLING   BREAST RECONSTRUCTION REVISION  Left 07-31-2014   CARDIAC CATHETERIZATION  2009---  [facility]   CARDIOVASCULAR STRESS TEST  2001   CARPAL TUNNEL RELEASE Bilateral 2007   CYSTOSCOPY W/ URETHROPEXY AND COLLAGEN INJECTION  X2  2007  CYSTOSCOPY WITH INJECTION  04/08/2012   Procedure: CYSTOSCOPY WITH INJECTION;  Surgeon: Martina Sinner, MD;  Location: [facility];  Service: Urology;  Laterality: N/A;  with botox   CYSTOSCOPY WITH INJECTION N/A 04/18/2013   Procedure: CYSTOSCOPY WITH BOTOX INJECTION;  Surgeon: Martina Sinner, MD;  Location: [facility];  Service: Urology;  Laterality: N/A;   CYSTOSCOPY WITH INJECTION N/A 08/15/2014   Procedure: CYSTOSCOPY WITH BOTOX INJECTION;  Surgeon: Martina Sinner, MD;  Location: [facility];  Service: Urology;  Laterality: N/A;   CYSTOSCOPY WITH INJECTION N/A 06/05/2015   Procedure: CYSTOSCOPY WITH  BOTOX INJECTION;  Surgeon: Alfredo Martinez, MD;  Location: [facility];  Service: Urology;  Laterality: N/A;   EXPLANTATION INTERSTIM IMPLANT/ CYSTO/ MACROPLASTIQUE INJECTION  08-05-2010   HERNIA REPAIR  1993   rt  inguinal   INTERSTIM IMPLANT PLACEMENT  2004   PLACEMENT PORT-A-CATH  12-09-2012   REMOVAL OF URINARY SLING  11-10-2006   RIGHT BREAST TISSUE EXPANDER REPLACED  04-04-2013   TRANSTHORACIC ECHOCARDIOGRAM  last one 10-22-2014  [facility]    NORMAL SIZE AND FUNCTION LV & RV/ LVSF NORMAL/ TRACE TR/ EF 55-60%/ NO PULMONARY HYPERTENSION (last echo showed mild pulmonary HTN)    Social History   Socioeconomic History   Marital status: Married    Spouse name: Not on file   Number of children: Not on file   Years of education: Not on file   Highest education level: Not on file  Occupational History   Not on file  Tobacco Use   Smoking status: Never   Smokeless tobacco: Never  Substance and Sexual Activity   Alcohol use: No   Drug use: No   Sexual activity: Not on file  Other Topics Concern   Not on file  Social History Narrative   Not on file   Social Drivers of Health   Financial Resource Strain: Low Risk  (06/09/2022)   [facility] to [facility]07[facility]23[facility] visits prior to 02/07/2023.   Overall Financial Resource Strain (CARDIA)    Difficulty of Paying Living Expenses: Not hard at all  Food Insecurity: Low Risk  (10/16/2023)   [facility]   Hunger Vital Sign    Worried About Running Out of Food in the Last Year: Never true    Ran Out of Food in the Last Year: Never true  Transportation Needs: No Transportation Needs (10/16/2023)   Received from [facility]    In the past 12 months, has lack of reliable transportation kept you from medical appointments, meetings, work or from getting things needed for daily living? : No  Physical Activity:  Unknown (06/09/2022)   [facility] prior to 02/07/2023., [facility], [facility] visits prior to 02/07/2023.   Exercise Vital Sign    Days of Exercise per Week: 0 days    Minutes of Exercise per Session: Not on file  Stress: No Stress Concern Present (06/09/2022)   [facility] prior to 02/07/2023., [facility], [facility] visits prior to 02/07/2023.   [employer] of Occupational Health - Occupational Stress Questionnaire    Feeling of Stress : Not at all  Social Connections: Socially Integrated (06/09/2022)   [facility] visits prior to 02/07/2023., [facility],  [facility] visits prior to 02/07/2023.   Social Connection and Isolation Panel [NHANES]    Frequency of Communication with Friends and Family: Once a week    Frequency of Social Gatherings with Friends and Family: Three times a week    Attends Religious Services: More than 4 times per year    Active Member of Clubs or Organizations: Yes    Attends Engineer, structural: More than 4 times per year    Marital Status: Married     FAMILY HISTORY:  We obtained a detailed, 4-generation family history.  Significant diagnoses are listed below: Family History  Problem Relation Age of Onset   Non-Hodgkin's lymphoma Mother 63   Lung cancer Father        d. 35   Suicidality Brother    Lung cancer Maternal Aunt    Cervical cancer Maternal Aunt    Breast cancer Paternal Aunt 45       d. 67   Breast cancer Paternal Aunt 54   Pancreatic cancer Paternal Uncle    Breast cancer Paternal Grandmother    Stomach cancer Paternal Grandfather    Pancreatic cancer Paternal Grandfather    Colon cancer Paternal Grandfather    Cancer Cousin        Anal cancer; pat first cousin     The patient had breast cancer at 75 and had a double mastectomy.  She has  two daughters who are cancer free and a brother who died by suicide.  Both parents are deceased.  The patient's mother had NHL at 19.  She had three brothers and three sisters, one sister had lung cancer and another had cervical cancer.  There is no other reported cancer history.  The patients father had lung cancer.  He had three brothers and four sisters.  Two sisters had breast cancer, one at 29 and another at 72.  One uncle had pancreatic cancer.  The paternal grandmother had breast cancer, and the grandfather had stomach and pancreatic cancer.  Diana Carroll is unaware of previous family history of genetic testing for hereditary cancer risks. There is no reported Ashkenazi Jewish ancestry. There is no known consanguinity.  GENETIC COUNSELING ASSESSMENT: Diana Carroll is a 63 y.o. female with a personal and family history of breast cancer which is somewhat suggestive of a hereditary cancer syndrome and predisposition to cancer given the number of women in the family with breast cancer as well as the combination of cancer. We, therefore, discussed and recommended the following at today's visit.   DISCUSSION: We discussed that, in general, most cancer is not inherited in families, but instead is sporadic or familial. Sporadic cancers occur by chance and typically happen at older ages (>50 years) as this type of cancer is caused by genetic changes acquired during an individual's lifetime. Some families have more cancers than would be expected by chance; however, the ages or types of cancer are not consistent with a known genetic mutation or known genetic mutations have been ruled out. This type of familial cancer is thought to be due to a combination of multiple genetic, environmental, hormonal, and lifestyle factors. While this combination of factors likely increases the risk of cancer, the exact source of this risk is not currently identifiable or testable.  We discussed that 5 - 10% of breast cancer  is hereditary, with most cases associated with BRCA mutations.  There are other genes that can be associated with hereditary breast cancer syndromes.  These  include ATM, CHEK2 and PALB2.  We discussed that testing is beneficial for several reasons including knowing how to follow individuals after completing their treatment, identifying whether potential treatment options such as PARP inhibitors would be beneficial, and understand if other family members could be at risk for cancer and allow them to undergo genetic testing.   We reviewed the characteristics, features and inheritance patterns of hereditary cancer syndromes. We also discussed genetic testing, including the appropriate family members to test, the process of testing, insurance coverage and turn-around-time for results. We discussed the implications of a negative, positive, carrier and/or variant of uncertain significant result. Diana Carroll  was offered a common hereditary cancer panel (36+ genes) and an expanded pan-cancer panel (70+ genes). Diana Carroll was informed of the benefits and limitations of each panel, including that expanded pan-cancer panels contain genes that do not have clear management guidelines at this point in time.  We also discussed that as the number of genes included on a panel increases, the chances of variants of uncertain significance increases. Diana Carroll decided to pursue genetic testing for the CancerNext-Expanded+RNAinsight gene panel.   The CancerNext-Expanded gene panel offered by [facility] and includes sequencing, rearrangement, and RNA analysis for the following 76 genes: AIP, ALK, APC, ATM, AXIN2, BAP1, BARD1, BMPR1A, BRCA1, BRCA2, BRIP1, CDC73, CDH1, CDK4, CDKN1B, CDKN2A, CEBPA, CHEK2, CTNNA1, DDX41, DICER1, ETV6, FH, FLCN, GATA2, LZTR1, MAX, MBD4, MEN1, MET, MLH1, MSH2, MSH3, MSH6, MUTYH, NF1, NF2, NTHL1, PALB2, PHOX2B, PMS2, POT1, PRKAR1A, PTCH1, PTEN, RAD51C, RAD51D, RB1, RET, RUNX1, SDHA, SDHAF2,  SDHB, SDHC, SDHD, SMAD4, SMARCA4, SMARCB1, SMARCE1, STK11, SUFU, TMEM127, TP53, TSC1, TSC2, VHL, and WT1 (sequencing and deletion/duplication); EGFR, HOXB13, KIT, MITF, PDGFRA, POLD1, and POLE (sequencing only); EPCAM and GREM1 (deletion/duplication only).    Based on Diana Carroll's personal and family history of cancer, she meets medical criteria for genetic testing. Despite that she meets criteria, she may still have an out of pocket cost. We discussed that if her out of pocket cost for testing is over $100, the laboratory will call and confirm whether she wants to proceed with testing.  If the out of pocket cost of testing is less than $100 she will be billed by the genetic testing laboratory.   We discussed that some people do not want to undergo genetic testing due to fear of genetic discrimination.  The Genetic Information Nondiscrimination Act (GINA) was signed into federal law in 2008. GINA prohibits health insurers and most employers from discriminating against individuals based on genetic information (including the results of genetic tests and family history information). According to GINA, health insurance companies cannot consider genetic information to be a preexisting condition, nor can they use it to make decisions regarding coverage or rates. GINA also makes it illegal for most employers to use genetic information in making decisions about hiring, firing, promotion, or terms of employment. It is important to note that GINA does not offer protections for life insurance, disability insurance, or long-term care insurance. GINA does not apply to those in the [employer], those who work for companies with less than 15 employees, and new life insurance or long-term disability insurance policies.  Health status due to a cancer diagnosis is not protected under GINA. More information about GINA can be found by visiting [employer][email or web address].  PLAN: After considering the risks, benefits, and limitations, Ms.  Carroll provided informed consent to pursue genetic testing.  Blood will be drawn at [facility] on March 21, 2024 and sent to  [facility] for analysis of the CancerNext-Expanded+RNAinsight. Results should be available within approximately 2-3 weeks' time, at which point they will be disclosed by telephone to Diana Carroll, as will any additional recommendations warranted by these results. Diana Carroll will receive a summary of her genetic counseling visit and a copy of her results once available. This information will also be available in Epic.   Lastly, we encouraged Diana Carroll to remain in contact with cancer genetics annually so that we can continuously update the family history and inform her of any changes in cancer genetics and testing that may be of benefit for this family.   Diana Carroll questions were answered to her satisfaction today. Our contact information was provided should additional questions or concerns arise. Thank you for the referral and allowing [place] to share in the care of your patient.   Benjaman Artman P. Lowell Guitar, MS, CGC Licensed, Patent attorney [email or web address] phone: [phone]  60 minutes were spent on the date of the encounter in service to the patient including preparation, face-to-face consultation, documentation and care coordination.  The patient was seen alone.  Drs. [facility] [facility], and/or [place] were available for questions, if needed..    _______________________________________________________________________ For Office Staff:  Number of people involved in session: 1 Was an Intern/ student involved with case: yes Company secretary

## 2024-03-17 ENCOUNTER — Encounter: Payer: Self-pay | Admitting: Genetic Counselor

## 2024-03-17 DIAGNOSIS — Z853 Personal history of malignant neoplasm of breast: Secondary | ICD-10-CM | POA: Insufficient documentation

## 2024-03-21 ENCOUNTER — Inpatient Hospital Stay

## 2024-04-08 ENCOUNTER — Encounter: Payer: Self-pay | Admitting: Genetic Counselor

## 2024-04-08 DIAGNOSIS — Z1501 Genetic susceptibility to malignant neoplasm of breast: Secondary | ICD-10-CM | POA: Insufficient documentation

## 2024-04-08 DIAGNOSIS — Z1379 Encounter for other screening for genetic and chromosomal anomalies: Secondary | ICD-10-CM | POA: Insufficient documentation

## 2024-04-13 ENCOUNTER — Telehealth: Payer: Self-pay | Admitting: Genetic Counselor

## 2024-04-13 NOTE — Telephone Encounter (Signed)
 Contacted patient in attempt to disclose results of genetic testing.  LVM with contact information requesting a call back.

## 2024-04-14 NOTE — Telephone Encounter (Signed)
 Patient returned call and LVM.  Contacted patient in attempt to disclose results of genetic testing.  LVM with Karen's contact info.

## 2024-04-18 ENCOUNTER — Telehealth: Payer: Self-pay | Admitting: Genetic Counselor

## 2024-04-18 NOTE — Telephone Encounter (Signed)
LM on VM that results are back and to please call.  Left CB number. 

## 2024-04-19 NOTE — Telephone Encounter (Signed)
 Revealed that a pathogenic variant in BRCA2 was identified.  Discussed that this is hereditary and can increase the risk for several cancers.  A telephone visit was scheduled for Thursday at 10 AM.  Lynnie Saucier gave verbal permission to call her cousin Tyra Galley and let her know the results to get her in for genetic testing.

## 2024-04-21 ENCOUNTER — Encounter: Payer: Self-pay | Admitting: Genetic Counselor

## 2024-04-21 ENCOUNTER — Inpatient Hospital Stay: Attending: Genetic Counselor | Admitting: Genetic Counselor

## 2024-04-21 DIAGNOSIS — Z1379 Encounter for other screening for genetic and chromosomal anomalies: Secondary | ICD-10-CM

## 2024-04-21 DIAGNOSIS — Z1509 Genetic susceptibility to other malignant neoplasm: Secondary | ICD-10-CM

## 2024-04-21 DIAGNOSIS — Z1501 Genetic susceptibility to malignant neoplasm of breast: Secondary | ICD-10-CM | POA: Diagnosis not present

## 2024-04-21 NOTE — Progress Notes (Signed)
 GENETIC TEST RESULTS   Patient Name: Diana Carroll Patient Age: 63 y.o. Encounter Date: 04/21/2024  Referring Provider: Cherilyn Corn, MD    Diana Carroll was seen in the Cancer Genetics clinic on May 15 due to a personal and family history of cancer and concern regarding a hereditary predisposition to cancer in the family due to a hereditary BRCA2 mutation. Please refer to the prior Genetics clinic note for more information regarding Diana Carroll's medical and family histories and our assessment at the time.   FAMILY HISTORY:  We obtained a detailed, 4-generation family history.  Significant diagnoses are listed below: Family History  Problem Relation Age of Onset   Non-Hodgkin's lymphoma Mother 107   Lung cancer Father        d. 38   Suicidality Brother    Lung cancer Maternal Aunt    Cervical cancer Maternal Aunt    Breast cancer Paternal Aunt 22       d. 47   Breast cancer Paternal Aunt 62   Pancreatic cancer Paternal Uncle    Breast cancer Paternal Grandmother    Stomach cancer Paternal Grandfather    Pancreatic cancer Paternal Grandfather    Colon cancer Paternal Grandfather    Cancer Cousin        Anal cancer; pat first cousin       The patient had breast cancer at 35 and had a double mastectomy.  She has two daughters who are cancer free and a brother who died by suicide.  Both parents are deceased.   The patient's mother had NHL at 38.  She had three brothers and three sisters, one sister had lung cancer and another had cervical cancer.  There is no other reported cancer history.   The patients father had lung cancer.  He had three brothers and four sisters.  Two sisters had breast cancer, one at 42 and another at 39.  One uncle had pancreatic cancer.  The paternal grandmother had breast cancer, and the grandfather had stomach and pancreatic cancer.   Diana Carroll is unaware of previous family history of genetic testing for hereditary cancer risks. There is no  reported Ashkenazi Jewish ancestry. 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 Specifically, this variant is BRCA2 [id number].    The test report has been scanned into EPIC and is located under the Molecular Pathology section of the Results Review tab.? A portion of the result report is included below for reference. Genetic testing reported out on Apr 08, 2024.      Clinical Information:   Hereditary breast and ovarian cancer (HBOC) syndrome is characterized by an increased lifetime risk for, generally, adult-onset cancers including, breast, contralateral breast, female breast, ovarian, prostate, melanoma and pancreatic.   The cancers associated with BRCA2 are:   Female breast cancer, 55-69% risk  In women with a history of breast cancer, the risk for contralateral breast cancer 10 years after breast cancer diagnosis is 10-30%.   Female breast cancer, up to an 8% risk  Ovarian cancer, 13-29% risk  Pancreatic cancer, 5-10% risk  Prostate cancer, 19-61% risk  Melanoma, elevated risk   Management Recommendations:   Breast Screening/Risk Reduction:   Women:  Breast awareness starting at age 62  Clinical breast examination every 6-12 months starting at age 72   Breast cancer screening:  Age 28-29 years, annual breast MRI with and without contrast (  or mammogram, if MRI is unavailable), although the age to initiate screening may be individualized based on family history  Age 82-75 years, annual mammogram and breast MRI with and without contrast  Age >75 years, management should be considered on an individual basis  For women with a BRCA2 pathogenic or likely pathogenic variant who are treated for breast cancer and have not had a bilateral mastectomy, screening with annual mammogram and breast MRI should continue as described above.  The option of prophylactic bilateral  risk-reducing mastectomy (RRM), removal of the breast tissue before cancer develops, is the best option for significantly decreasing the risk of developing breast cancer. Studies have shown mastectomies reduce the risk of breast cancer by 90-95% in women with a BRCA2 mutation.    Males:  Breast self-exam training and education starting at age 73 years  Annual clinical breast exam starting at age 61 years   Consider annual mammogram starting at age 60 or 10 years before the earliest known female breast cancer in the family (whichever comes first).   Gynecological Cancer Screening/Risk Reduction:  It is recommended that women with a BRCA2 mutation have a risk-reducing salpingo oophorectomy (RRSO), removal of the ovaries and fallopian tubes. It is reasonable to delay RRSO until age 49-45 years unless age at diagnosis in the family warrants earlier age for consideration of RRSO.   Having a RRSO is estimated to reduce the risk of ovarian cancer by up to 96%. There is still a small risk of developing an "ovarian-like" cancer in the lining of the abdomen, called the peritoneum.  Another benefit to having the ovaries removed is the risk reduction for breast cancer. If the ovaries are removed before menopause, the risk of developing breast cancer is reduced.  Ovarian cancer screening is an option for women who chose not to have a RRSO or who have not yet completed their family. Current screening methods for ovarian cancer are neither sensitive nor specific, meaning that often early stage ovarian cancer cannot be diagnosed through this screening.  Screening can also be falsely positive with no cancer present. For this reason, RRSO is recommended over screening. If ovarian cancer screening is recommended by a physician, it could include:  CA-125 blood tests  Transvaginal ultrasounds  Clinical pelvic exams   Skin Cancer Screening and Risk Reduction:  Regular skin self-examinations  Individuals should notify  their physicians of any changes to moles such as increasing in size, darkening in color, or other change in appearance.  Annual skin examinations by a dermatologist   Follow sun-safety recommendations such as:  Using UVA and UVB 30 SPF or higher sunscreen  Avoiding sunburns  Limiting sun exposure, especially during the hours of 11am-4pm   Wearing protective clothing and sunglasses  Avoid using tanning beds  For more information about the prevention of melanoma visit [email or web address]   Prostate Cancer Screening:  Annual digital rectal exam (DRE) at age 37  Annual PSA blood test at age 28   Pancreatic Cancer Screening/Risk Reduction:  Avoid smoking, heavy alcohol use, and obesity.  It has been suggested that pancreatic cancer screening be limited to those with a family history of pancreatic cancer (first- or second-degree relative). Ideally, screening should be performed in experienced centers utilizing a multidisciplinary approach under research conditions. Recommended screening could include annual endoscopic ultrasound (preferred) and/or MRI of the pancreas starting at age 6 or 61 years younger than the earliest age diagnosis in the family.  Diana Carroll is scheduled for a colonoscopy in  September 2025.  She will discuss pancreatic cancer screening with her GI specialist at [facility]. We will also research the sites that do pancreatic cancer screening, in case [facility] does not do them.  Additional Considerations:  Individuals at risk for developing breast and ovarian cancer may benefit from the use of medication to reduce their risk for cancer. These medications are referred to as chemoprevention. For example, oral contraceptive use has been shown to reduce the risk of ovarian cancer by approximately 60% in BRCA2 mutation carriers if taken for at least 5 years. This risk reduction remains even after discontinuation of oral contraceptives.  Recent studies have suggested  PARP inhibitors may be a beneficial chemotherapeutic agent for a subset of patients with BRCA2-associated breast, ovarian, prostate, and pancreatic cancers. Clinical trials are currently in process to determine if and how these agents can be useful in the treatment of BRCA2 cancer patients  Patients of reproductive age should be made aware of options for prenatal diagnosis and assisted reproduction including pre-implantation genetic diagnosis.  Individuals with a single pathogenic BRCA2 variant are carriers of Fanconi anemia. Fanconi anemia is characterized by developmental delay apparent from infancy, short stature, microcephaly, and coarse dysmorphic features. For there to be a risk of Fanconi anemia in offspring, both the patient and their partner would each have to carry a pathogenic variant in BRCA2. In this case, the risk of having an affected child is 25%.    This information is based on current understanding of the gene and may change in the future.   Implications for Family Members:  Hereditary predisposition to cancer due to pathogenic variants in the BRCA2 gene has autosomal dominant inheritance. This means that an individual with a pathogenic variant has a 50% chance of passing the condition on to his/her offspring. Identification of a pathogenic variant allows for the recognition of at-risk relatives who can pursue testing for the familial variant.   Family members are encouraged to consider genetic testing for this familial pathogenic variant. As there are generally no childhood cancer risks associated with a single pathogenic variant in the BRCA2 gene, individuals in the family are not recommended to have testing until they reach at least 65 years of age. They may contact our office at [phone] for more information or to schedule an appointment. Complimentary testing for the familial variant is available for 90 days after the genetic testing report date.  Family members who live outside  of the area are encouraged to find a genetic counselor in their area by visiting: [email or web address].   Resources:  FORCE (Facing Our Risk of Cancer Empowered) is a resource for those with a hereditary predisposition to develop cancer.  [employer]CE provides information about risk reduction, advocacy, legislation, and clinical trials.  Additionally, [employer] provides a platform for collaboration and support; which includes: peer navigation, message boards, local support groups, a toll-free help line, research registry and recruitment, advocate training, published medical research, webinars, brochures, mastectomy photos, and more.  For more information, visit www.facingourrisk.org.  A more local group called [employer] (www. Nothing[employer].org) is a Forensic scientist that supports individuals on their path through hereditary cancer. [employer] provides peer support (virtual and in person), private online support group, care package for surgery and financial assistance.  We encouraged Diana Carroll to remain in contact with us  on an annual basis so we can update her personal and family histories, and let her know of advances in cancer genetics that may benefit the family. Our contact number  was provided. Diana Carroll questions were answered to her satisfaction today, and she knows she is welcome to call anytime with additional questions.   Briahnna Harries P. [place], MS, [facility] Licensed, Patent attorney [email or web address] phone: [phone]  80 minutes were spent on the date of the encounter in service to the patient including preparation, face-to-face consultation, documentation and care coordination.

## 2024-05-24 ENCOUNTER — Encounter: Payer: Self-pay | Admitting: Genetic Counselor
